# Patient Record
Sex: Male | Born: 1980 | Race: White | Hispanic: No | Marital: Single | Smoking: Current every day smoker
Health system: Southern US, Community
[De-identification: ages and names within clinical notes are randomized; demographics above are authoritative.]

## PROBLEM LIST (undated history)

## (undated) DIAGNOSIS — F39 Unspecified mood [affective] disorder: Secondary | ICD-10-CM

## (undated) DIAGNOSIS — A6920 Lyme disease, unspecified: Secondary | ICD-10-CM

## (undated) DIAGNOSIS — B1920 Unspecified viral hepatitis C without hepatic coma: Secondary | ICD-10-CM

## (undated) DIAGNOSIS — G039 Meningitis, unspecified: Secondary | ICD-10-CM

## (undated) DIAGNOSIS — F1911 Other psychoactive substance abuse, in remission: Secondary | ICD-10-CM

## (undated) DIAGNOSIS — H9190 Unspecified hearing loss, unspecified ear: Secondary | ICD-10-CM

## (undated) DIAGNOSIS — F1410 Cocaine abuse, uncomplicated: Secondary | ICD-10-CM

## (undated) DIAGNOSIS — F4310 Post-traumatic stress disorder, unspecified: Secondary | ICD-10-CM

## (undated) DIAGNOSIS — F639 Impulse disorder, unspecified: Secondary | ICD-10-CM

## (undated) DIAGNOSIS — M542 Cervicalgia: Secondary | ICD-10-CM

## (undated) HISTORY — PX: DG THUMB LEFT HAND: HXRAD1658

## (undated) HISTORY — PX: SKIN GRAFT: SHX250

## (undated) HISTORY — PX: MR HAND RIGHT (ARMC HX): HXRAD1771

---
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
## 2008-07-10 ENCOUNTER — Inpatient Hospital Stay (HOSPITAL_COMMUNITY): Admission: RE | Admit: 2008-07-10 | Discharge: 2008-07-13 | Payer: Self-pay | Admitting: Psychiatry

## 2008-07-10 ENCOUNTER — Ambulatory Visit: Payer: Self-pay | Admitting: Psychiatry

## 2008-07-22 ENCOUNTER — Emergency Department (HOSPITAL_COMMUNITY): Admission: EM | Admit: 2008-07-22 | Discharge: 2008-07-22 | Payer: Self-pay | Admitting: Emergency Medicine

## 2008-07-28 ENCOUNTER — Emergency Department (HOSPITAL_COMMUNITY): Admission: EM | Admit: 2008-07-28 | Discharge: 2008-07-28 | Payer: Self-pay | Admitting: Emergency Medicine

## 2008-08-19 ENCOUNTER — Emergency Department (HOSPITAL_COMMUNITY): Admission: EM | Admit: 2008-08-19 | Discharge: 2008-08-19 | Payer: Self-pay | Admitting: Emergency Medicine

## 2009-04-05 ENCOUNTER — Emergency Department (HOSPITAL_COMMUNITY): Admission: EM | Admit: 2009-04-05 | Discharge: 2009-04-05 | Payer: Self-pay | Admitting: Emergency Medicine
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
## 2009-10-20 ENCOUNTER — Emergency Department (HOSPITAL_COMMUNITY): Admission: EM | Admit: 2009-10-20 | Discharge: 2009-10-20 | Payer: Self-pay | Admitting: Emergency Medicine

## 2009-11-08 ENCOUNTER — Other Ambulatory Visit: Payer: Self-pay | Admitting: Orthopedic Surgery

## 2009-11-08 ENCOUNTER — Ambulatory Visit (HOSPITAL_COMMUNITY): Admission: RE | Admit: 2009-11-08 | Discharge: 2009-11-08 | Payer: Self-pay | Admitting: Psychiatry

## 2009-11-09 ENCOUNTER — Other Ambulatory Visit: Payer: Self-pay | Admitting: Orthopedic Surgery

## 2009-11-09 ENCOUNTER — Inpatient Hospital Stay (HOSPITAL_COMMUNITY): Admission: AD | Admit: 2009-11-09 | Discharge: 2009-11-12 | Payer: Self-pay | Admitting: Psychiatry

## 2009-11-09 ENCOUNTER — Ambulatory Visit: Payer: Self-pay | Admitting: Psychiatry

## 2009-11-12 ENCOUNTER — Emergency Department (HOSPITAL_COMMUNITY): Admission: EM | Admit: 2009-11-12 | Discharge: 2009-11-14 | Payer: Self-pay | Admitting: Emergency Medicine

## 2010-01-06 ENCOUNTER — Emergency Department (HOSPITAL_COMMUNITY): Admission: EM | Admit: 2010-01-06 | Discharge: 2010-01-06 | Payer: Self-pay | Admitting: Emergency Medicine

## 2010-01-07 ENCOUNTER — Emergency Department (HOSPITAL_COMMUNITY): Admission: EM | Admit: 2010-01-07 | Discharge: 2010-01-07 | Payer: Self-pay | Admitting: Family Medicine

## 2010-12-09 LAB — URINALYSIS, ROUTINE W REFLEX MICROSCOPIC
Glucose, UA: NEGATIVE mg/dL
Ketones, ur: NEGATIVE mg/dL
Nitrite: NEGATIVE
Specific Gravity, Urine: 1.023 (ref 1.005–1.030)
pH: 5.5 (ref 5.0–8.0)

## 2010-12-09 LAB — DIFFERENTIAL
Basophils Absolute: 0 10*3/uL (ref 0.0–0.1)
Basophils Absolute: 0 10*3/uL (ref 0.0–0.1)
Basophils Relative: 0 % (ref 0–1)
Eosinophils Absolute: 0 10*3/uL (ref 0.0–0.7)
Eosinophils Absolute: 0.1 10*3/uL (ref 0.0–0.7)
Eosinophils Relative: 0 % (ref 0–5)
Lymphocytes Relative: 15 % (ref 12–46)
Lymphocytes Relative: 31 % (ref 12–46)
Lymphs Abs: 1.7 10*3/uL (ref 0.7–4.0)
Monocytes Absolute: 0.7 10*3/uL (ref 0.1–1.0)
Neutro Abs: 5.7 10*3/uL (ref 1.7–7.7)

## 2010-12-09 LAB — CBC
HCT: 41.9 % (ref 39.0–52.0)
HCT: 43.3 % (ref 39.0–52.0)
Hemoglobin: 13.9 g/dL (ref 13.0–17.0)
Hemoglobin: 14.7 g/dL (ref 13.0–17.0)
MCHC: 33.9 g/dL (ref 30.0–36.0)
MCV: 81.9 fL (ref 78.0–100.0)
MCV: 82.9 fL (ref 78.0–100.0)
Platelets: 240 10*3/uL (ref 150–400)
Platelets: 265 10*3/uL (ref 150–400)
RBC: 5.11 MIL/uL (ref 4.22–5.81)
RBC: 5.22 MIL/uL (ref 4.22–5.81)
RDW: 12.9 % (ref 11.5–15.5)
RDW: 13.2 % (ref 11.5–15.5)
WBC: 9.1 10*3/uL (ref 4.0–10.5)

## 2010-12-09 LAB — BASIC METABOLIC PANEL
Calcium: 9.7 mg/dL (ref 8.4–10.5)
Calcium: 9.7 mg/dL (ref 8.4–10.5)
Chloride: 104 mEq/L (ref 96–112)
Chloride: 107 mEq/L (ref 96–112)
Creatinine, Ser: 1.05 mg/dL (ref 0.4–1.5)
GFR calc Af Amer: >60 mL/min (ref >60)
GFR calc Af Amer: >60 mL/min (ref >60)
GFR calc non Af Amer: >60 mL/min (ref >60)
Glucose, Bld: 98 mg/dL (ref 70–99)
Potassium: 4.2 mEq/L (ref 3.5–5.1)
Potassium: 4.6 mEq/L (ref 3.5–5.1)
Sodium: 139 mEq/L (ref 135–145)

## 2010-12-09 LAB — RAPID URINE DRUG SCREEN, HOSP PERFORMED
Amphetamines: NOT DETECTED
Benzodiazepines: NOT DETECTED

## 2010-12-09 LAB — ETHANOL
Alcohol, Ethyl (B): <5 mg/dL (ref 0–10)
Alcohol, Ethyl (B): <5 mg/dL (ref 0–10)

## 2010-12-09 LAB — TRICYCLICS SCREEN, URINE: TCA Scrn: NOT DETECTED
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
## 2011-06-20 LAB — COMPREHENSIVE METABOLIC PANEL
AST: 20
Albumin: 3.9
Alkaline Phosphatase: 77
Calcium: 9.2
Creatinine, Ser: 1
Sodium: 140
Total Bilirubin: 0.6
Total Protein: 7

## 2011-06-20 LAB — URINALYSIS, ROUTINE W REFLEX MICROSCOPIC
Glucose, UA: NEGATIVE
Hgb urine dipstick: NEGATIVE
Urobilinogen, UA: 0.2
pH: 6

## 2011-06-20 LAB — DRUGS OF ABUSE SCREEN W/O ALC, ROUTINE URINE
Amphetamine Screen, Ur: NEGATIVE
Creatinine,U: 279.2
Marijuana Metabolite: POSITIVE — AB

## 2011-06-20 LAB — CBC
HCT: 44.1
MCHC: 32.9
RDW: 12.6
WBC: 8.2

## 2011-06-20 LAB — OPIATE, QUANTITATIVE, URINE
Morphine, Confirm: 390 ng/mL
Oxycodone, ur: 270 ng/mL

## 2011-08-07 ENCOUNTER — Emergency Department (HOSPITAL_COMMUNITY): Payer: Self-pay

## 2011-08-07 ENCOUNTER — Emergency Department (HOSPITAL_COMMUNITY)
Admission: EM | Admit: 2011-08-07 | Discharge: 2011-08-07 | Disposition: A | Discharge status: Home / Self Care | Payer: Self-pay | Attending: Emergency Medicine | Admitting: Emergency Medicine

## 2011-08-07 ENCOUNTER — Encounter: Payer: Self-pay | Admitting: *Deleted

## 2011-08-07 DIAGNOSIS — S60229A Contusion of unspecified hand, initial encounter: Secondary | ICD-10-CM | POA: Insufficient documentation

## 2011-08-07 DIAGNOSIS — S0990XA Unspecified injury of head, initial encounter: Secondary | ICD-10-CM | POA: Insufficient documentation

## 2011-08-07 DIAGNOSIS — R51 Headache: Secondary | ICD-10-CM | POA: Insufficient documentation

## 2011-08-07 DIAGNOSIS — S1093XA Contusion of unspecified part of neck, initial encounter: Secondary | ICD-10-CM | POA: Insufficient documentation

## 2011-08-07 DIAGNOSIS — S0083XA Contusion of other part of head, initial encounter: Secondary | ICD-10-CM

## 2011-08-07 DIAGNOSIS — M79609 Pain in unspecified limb: Secondary | ICD-10-CM | POA: Insufficient documentation

## 2011-08-07 DIAGNOSIS — M542 Cervicalgia: Secondary | ICD-10-CM | POA: Insufficient documentation

## 2011-08-07 DIAGNOSIS — M25519 Pain in unspecified shoulder: Secondary | ICD-10-CM | POA: Insufficient documentation

## 2011-08-07 DIAGNOSIS — S0003XA Contusion of scalp, initial encounter: Secondary | ICD-10-CM | POA: Insufficient documentation

## 2011-08-07 DIAGNOSIS — M549 Dorsalgia, unspecified: Secondary | ICD-10-CM | POA: Insufficient documentation

## 2011-08-07 DIAGNOSIS — H5789 Other specified disorders of eye and adnexa: Secondary | ICD-10-CM | POA: Insufficient documentation

## 2011-08-07 DIAGNOSIS — S60222A Contusion of left hand, initial encounter: Secondary | ICD-10-CM

## 2011-08-07 HISTORY — DX: Unspecified hearing loss, unspecified ear: H91.90

## 2011-08-07 LAB — POCT I-STAT, CHEM 8
BUN: 15 mg/dL (ref 6–23)
Calcium, Ion: 1.12 mmol/L (ref 1.12–1.32)
Chloride: 108 mEq/L (ref 96–112)
Creatinine, Ser: 1.1 mg/dL (ref 0.50–1.35)
Glucose, Bld: 106 mg/dL — ABNORMAL HIGH (ref 70–99)
HCT: 46 % (ref 39.0–52.0)
Hemoglobin: 15.6 g/dL (ref 13.0–17.0)
Potassium: 4.2 mEq/L (ref 3.5–5.1)
Sodium: 140 mEq/L (ref 135–145)
TCO2: 22 mmol/L (ref 0–100)

## 2011-08-07 MED ORDER — HYDROMORPHONE HCL PF 1 MG/ML IJ SOLN
1.0000 mg | Freq: Once | INTRAMUSCULAR | Status: AC
  Administered 2011-08-07: 1 mg via INTRAVENOUS
  Filled 2011-08-07: qty 1

## 2011-08-07 MED ORDER — IBUPROFEN 200 MG PO TABS
400.0000 mg | ORAL_TABLET | Freq: Once | ORAL | Status: AC
  Administered 2011-08-07: 400 mg via ORAL
  Filled 2011-08-07: qty 2

## 2011-08-07 MED ORDER — SODIUM CHLORIDE 0.9 % IV BOLUS (SEPSIS)
1000.0000 mL | Freq: Once | INTRAVENOUS | Status: AC
  Administered 2011-08-07: 1000 mL via INTRAVENOUS

## 2011-08-07 NOTE — ED Provider Notes (Signed)
Medical screening examination/treatment/procedure(s) were performed by non-physician practitioner and as supervising physician I was immediately available for consultation/collaboration.  Joanathan Affeldt, MD 08/07/11 2212 

## 2011-08-07 NOTE — ED Notes (Signed)
Pt to CT at this time.  C-collar remains in place.  Unable to move pt to another room at this time due to high aquity

## 2011-08-07 NOTE — ED Notes (Signed)
[employer] is at the bedside at this time.  Pt being checked in and has VS checked at this time.

## 2011-08-07 NOTE — ED Notes (Signed)
Pt undressed, clothes with [employer].  Placed in hospital gown.  Pt is alert and oriented.  Sever left shoulder pain, ? Deformity, pt can not move the arm.  GCS 15, PERRLA
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
## 2012-05-09 ENCOUNTER — Emergency Department (HOSPITAL_COMMUNITY)
Admission: EM | Admit: 2012-05-09 | Discharge: 2012-05-09 | Disposition: A | Discharge status: Home / Self Care | Payer: Self-pay | Attending: Emergency Medicine | Admitting: Emergency Medicine

## 2012-05-09 DIAGNOSIS — F17200 Nicotine dependence, unspecified, uncomplicated: Secondary | ICD-10-CM | POA: Insufficient documentation

## 2012-05-09 DIAGNOSIS — M542 Cervicalgia: Secondary | ICD-10-CM | POA: Insufficient documentation

## 2012-05-09 MED ORDER — OXYCODONE HCL 5 MG PO TABS: 5.0000 mg | ORAL_TABLET | ORAL | Status: AC | PRN

## 2012-05-09 NOTE — ED Notes (Signed)
Pt given on prescrip and told not to drive while taking pain meds.
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
## 2012-05-09 NOTE — ED Provider Notes (Signed)
Medical screening examination/treatment/procedure(s) were performed by non-physician practitioner and as supervising physician I was immediately available for consultation/collaboration.    Calistro Rauf L Taryn Shellhammer, MD 05/09/12 2331 

## 2012-05-09 NOTE — ED Provider Notes (Signed)
History     CSN: [id number]  Arrival date & time 05/09/12  1612   None     Chief Complaint  Patient presents with  . Neck Pain    (Consider location/radiation/quality/duration/timing/severity/associated sxs/prior treatment) HPI  31 y.o. male in no acute distress complaining of exacerbation to chronic neck pain. Patient denies any numbness, paresthesias or weakness. He also denies any recent trauma. Used to be taking Suboxone for pain but he says he cannot afford it because his Medicaid was cut off. Patient says he has an appointment with his primary care doctor for pain management next week.   Past Medical History  Diagnosis Date  . HOH (hard of hearing)     No past surgical history on file.  No family history on file.  History  Substance Use Topics  . Smoking status: Current Everyday Smoker -- 0.5 packs/day    Types: Cigarettes  . Smokeless tobacco: Not on file  . Alcohol Use: Yes      Review of Systems  Musculoskeletal: Positive for back pain.  All other systems reviewed and are negative.    Allergies  Tylenol  Home Medications   Current Outpatient Rx  Name Route Sig Dispense Refill  . BUPRENORPHINE HCL-NALOXONE HCL 8-2 MG SL SUBL Sublingual Place 1 tablet under the tongue 2 (two) times daily.      BP 137/78  Pulse 66  Temp 98.3 F (36.8 C) (Oral)  Resp 15  SpO2 100%  Physical Exam  Nursing note and vitals reviewed. Constitutional: He is oriented to person, place, and time. He appears well-developed and well-nourished. No distress.  HENT:  Head: Normocephalic.  Eyes: Conjunctivae and EOM are normal.  Neck: Normal range of motion. Neck supple.       Surgical scar  Cardiovascular: Normal rate.   Pulmonary/Chest: Effort normal.  Musculoskeletal: Normal range of motion.  Neurological: He is alert and oriented to person, place, and time.       Strength 5 out of 5x4 extremities.  Skin: Skin is warm and dry.  Psychiatric: He has a normal mood and  affect.    ED Course  Procedures (including critical care time)  Labs Reviewed - No data to display No results found.   1. Cervicalgia       MDM  Patient presenting with exacerbation 2 chronic neck pain he was previously taking Suboxone and patient states he cannot afford it anymore I will bring him for 10 5 mg oxycodone and I have instructed him that this pain must be managed at a primary care. Patient states he has a primary care appointment next week. Pt verbalized understanding and agrees with care plan. Outpatient follow-up and return precautions given.           Wynetta Emery, PA-C 05/09/12 1850

## 2012-12-05 ENCOUNTER — Encounter (HOSPITAL_COMMUNITY): Payer: Self-pay

## 2012-12-05 ENCOUNTER — Emergency Department (HOSPITAL_COMMUNITY)
Admission: EM | Admit: 2012-12-05 | Discharge: 2012-12-05 | Disposition: A | Discharge status: Other Acute Inpatient Hospital | Payer: Self-pay | Attending: Emergency Medicine | Admitting: Emergency Medicine

## 2012-12-05 ENCOUNTER — Encounter (HOSPITAL_COMMUNITY): Payer: Self-pay | Admitting: Emergency Medicine

## 2012-12-05 ENCOUNTER — Inpatient Hospital Stay (HOSPITAL_COMMUNITY)
Admission: AD | Admit: 2012-12-05 | Discharge: 2012-12-09 | DRG: 897 | Disposition: A | Discharge status: Home / Self Care | Payer: Federal, State, Local not specified - Other | Source: Intra-hospital | Attending: Psychiatry | Admitting: Psychiatry

## 2012-12-05 DIAGNOSIS — F1410 Cocaine abuse, uncomplicated: Secondary | ICD-10-CM | POA: Insufficient documentation

## 2012-12-05 DIAGNOSIS — F39 Unspecified mood [affective] disorder: Secondary | ICD-10-CM | POA: Diagnosis present

## 2012-12-05 DIAGNOSIS — R45851 Suicidal ideations: Secondary | ICD-10-CM

## 2012-12-05 DIAGNOSIS — Z8669 Personal history of other diseases of the nervous system and sense organs: Secondary | ICD-10-CM | POA: Insufficient documentation

## 2012-12-05 DIAGNOSIS — Z8661 Personal history of infections of the central nervous system: Secondary | ICD-10-CM | POA: Insufficient documentation

## 2012-12-05 DIAGNOSIS — F1120 Opioid dependence, uncomplicated: Principal | ICD-10-CM | POA: Diagnosis present

## 2012-12-05 DIAGNOSIS — F1110 Opioid abuse, uncomplicated: Secondary | ICD-10-CM

## 2012-12-05 DIAGNOSIS — Z79899 Other long term (current) drug therapy: Secondary | ICD-10-CM | POA: Insufficient documentation

## 2012-12-05 DIAGNOSIS — F1910 Other psychoactive substance abuse, uncomplicated: Secondary | ICD-10-CM

## 2012-12-05 DIAGNOSIS — Z8739 Personal history of other diseases of the musculoskeletal system and connective tissue: Secondary | ICD-10-CM | POA: Insufficient documentation

## 2012-12-05 DIAGNOSIS — F17200 Nicotine dependence, unspecified, uncomplicated: Secondary | ICD-10-CM | POA: Insufficient documentation

## 2012-12-05 DIAGNOSIS — F639 Impulse disorder, unspecified: Secondary | ICD-10-CM | POA: Diagnosis present

## 2012-12-05 DIAGNOSIS — F4310 Post-traumatic stress disorder, unspecified: Secondary | ICD-10-CM | POA: Diagnosis present

## 2012-12-05 HISTORY — DX: Cervicalgia: M54.2

## 2012-12-05 HISTORY — DX: Meningitis, unspecified: G03.9

## 2012-12-05 LAB — COMPREHENSIVE METABOLIC PANEL
Albumin: 4.2 g/dL (ref 3.5–5.2)
BUN: 11 mg/dL (ref 6–23)
Chloride: 99 mEq/L (ref 96–112)
Creatinine, Ser: 1.02 mg/dL (ref 0.50–1.35)
GFR calc Af Amer: >90 mL/min (ref >90)
GFR calc non Af Amer: >90 mL/min (ref >90)
Total Bilirubin: 0.3 mg/dL (ref 0.3–1.2)

## 2012-12-05 LAB — CBC
HCT: 42.5 % (ref 39.0–52.0)
MCV: 81.6 fL (ref 78.0–100.0)
RDW: 13.3 % (ref 11.5–15.5)
WBC: 11.1 10*3/uL — ABNORMAL HIGH (ref 4.0–10.5)

## 2012-12-05 LAB — SALICYLATE LEVEL: Salicylate Lvl: <2 mg/dL — ABNORMAL LOW (ref 2.8–20.0)

## 2012-12-05 LAB — ETHANOL: Alcohol, Ethyl (B): <11 mg/dL (ref 0–11)

## 2012-12-05 MED ORDER — LORAZEPAM 1 MG PO TABS
1.0000 mg | ORAL_TABLET | Freq: Three times a day (TID) | ORAL | Status: DC | PRN
  Administered 2012-12-05: 1 mg via ORAL
  Filled 2012-12-05: qty 1

## 2012-12-05 MED ORDER — ONDANSETRON HCL 4 MG PO TABS: 4.0000 mg | ORAL_TABLET | Freq: Three times a day (TID) | ORAL | Status: DC | PRN

## 2012-12-05 MED ORDER — NICOTINE 21 MG/24HR TD PT24: 21.0000 mg | MEDICATED_PATCH | Freq: Every day | TRANSDERMAL | Status: DC

## 2012-12-05 MED ORDER — ALUM & MAG HYDROXIDE-SIMETH 200-200-20 MG/5ML PO SUSP: 30.0000 mL | ORAL | Status: DC | PRN

## 2012-12-05 MED ORDER — IBUPROFEN 600 MG PO TABS: 600.0000 mg | ORAL_TABLET | Freq: Three times a day (TID) | ORAL | Status: DC | PRN

## 2012-12-05 MED ORDER — ZIPRASIDONE MESYLATE 20 MG IM SOLR: 20.0000 mg | INTRAMUSCULAR | Status: DC | PRN

## 2012-12-05 MED ORDER — ZOLPIDEM TARTRATE 5 MG PO TABS: 5.0000 mg | ORAL_TABLET | Freq: Every evening | ORAL | Status: DC | PRN
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
## 2012-12-05 NOTE — Progress Notes (Signed)
  CM spoke with pt who confirms self pay [facility] resident with no pcp. CM discussed written information for self pay pcps, importance of pcp for f/u care, www.needymeds.org, discounted pharmacies, MATCH program and other [place] resources such as financial assistance, DSS and  health department Reviewed Health connect number to assist with finding self pay provider close to pt's residence. Reviewed resources for [place] self pay pcps like [facility], family medicine at [address], [facility] family practice, general medical clinics, [facility] urgent care plus others, [facility] out patient [facility]ies, housing, affordable care act/health reform (deadline 12/16/12) and other resources in [employer]. Pt voiced understanding and appreciation of resources provided   Pt agreed to receive these written resources upon discharge along with her d/c instructions. BH RN informed and CM requested pt be given written information at d/c

## 2012-12-05 NOTE — ED Notes (Signed)
Per patient, relapse on cocaine last night, was clean for 6 months, sat in floor with knives,started having suicidal thoughts

## 2012-12-05 NOTE — ED Provider Notes (Signed)
History     CSN: [id number]  Arrival date & time 12/05/12  4098   First MD Initiated Contact with Patient 12/05/12 [phone]      Chief Complaint  Patient presents with  . Medical Clearance    (Consider location/radiation/quality/duration/timing/severity/associated sxs/prior treatment) HPI Pt presenting with c/o substance abuse and suicidal thoughts/ideations. Pt states he had not been using for 6 months, then last night used cocaine "all night"  He states he was trying to kill himself with cocaine.  Also sat with a knife and tried to use the knife to kill himself.  He states over the past month symptoms have been worsening.  He had outpatient appiontment tomorrow to try to get into [facility], but states that he feels he cannot wait this long.  No recent illness.  No fever, no vomiting.  There are no other associated systemic symptoms, there are no other alleviating or modifying factors.   Past Medical History  Diagnosis Date  . HOH (hard of hearing)   . Neck pain   . Meningitis     Past Surgical History  Procedure Laterality Date  . Skin graft      No family history on file.  History  Substance Use Topics  . Smoking status: Current Every Day Smoker -- 0.50 packs/day    Types: Cigarettes  . Smokeless tobacco: Not on file  . Alcohol Use: No      Review of Systems ROS reviewed and all otherwise negative except for mentioned in HPI  Allergies  Tylenol  Home Medications   Current Outpatient Rx  Name  Route  Sig  Dispense  Refill  . buprenorphine-naloxone (SUBOXONE) 8-2 MG SUBL   Sublingual   Place 1 tablet under the tongue 2 (two) times daily.         . diazepam (VALIUM) 10 MG tablet   Oral   Take 10 mg by mouth every 6 (six) hours as needed for anxiety.         Marland Kitchen zolpidem (AMBIEN) 10 MG tablet   Oral   Take 10 mg by mouth at bedtime as needed for sleep.           BP 126/74  Pulse 84  Temp(Src) 98.5 F (36.9 C) (Oral)  Resp 18  SpO2 97% Vitals  reviewed Physical Exam Physical Examination: General appearance - alert, well appearing, and in no distress Mental status - alert, oriented to person, place, and time Eyes - no scleral icterus, no conjunctival injection Mouth - mucous membranes moist, pharynx normal without lesions Chest - clear to auscultation, no wheezes, rales or rhonchi, symmetric air entry Heart - normal rate, regular rhythm, normal S1, S2, no murmurs, rubs, clicks or gallops Musculoskeletal - no joint tenderness, deformity or swelling Extremities - peripheral pulses normal, no pedal edema, no clubbing or cyanosis Skin - normal coloration and turgor, no rashes Psych- flat affect, but cooperative  ED Course  Procedures (including critical care time)  11:09 AM  i have initiated IVC papers on this patient. He told me that he felt suicidal and homicidal, then when his mother left, was trying to leave the ED and stated he wanted to go to another hospital.  As he is suicidal I do not believe I can safely allow him to leave.  Will need ACT consult, telepsych, psych holding orders written.    Labs Reviewed  CBC - Abnormal; Notable for the following:    WBC 11.1 (*)    All other components within  normal limits  COMPREHENSIVE METABOLIC PANEL - Abnormal; Notable for the following:    Glucose, Bld 103 (*)    All other components within normal limits  SALICYLATE LEVEL - Abnormal; Notable for the following:    Salicylate Lvl <2.0 (*)    All other components within normal limits  URINE RAPID DRUG SCREEN (HOSP PERFORMED) - Abnormal; Notable for the following:    Opiates POSITIVE (*)    Cocaine POSITIVE (*)    All other components within normal limits  ACETAMINOPHEN LEVEL  ETHANOL   No results found.   1. Suicidal ideation   2. Substance abuse       MDM  Pt presenting with c/o feeling suicidal and cocaine abuse.  Psych holding orders written, d/w ACT team, Dr. Martin Majestic will see patient this afternoon.  Pt is medically  clear at this time.          Ethelda Chick, MD 12/05/12 [phone]

## 2012-12-05 NOTE — ED Notes (Signed)
Patient restless, requesting anxiety medication. Medication given as ordered by MD.
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
## 2012-12-05 NOTE — ED Notes (Signed)
Patient states "he doesn't know what he will do"-states he recently started using cocaine b/c he was placed on ceboxone and they "go together"
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
## 2012-12-05 NOTE — ED Notes (Signed)
Pt. Mother took his book bag, note book and clothing home.

## 2012-12-06 ENCOUNTER — Encounter (HOSPITAL_COMMUNITY): Payer: Self-pay | Admitting: Psychiatry

## 2012-12-06 DIAGNOSIS — F4310 Post-traumatic stress disorder, unspecified: Secondary | ICD-10-CM | POA: Diagnosis present

## 2012-12-06 DIAGNOSIS — F1410 Cocaine abuse, uncomplicated: Secondary | ICD-10-CM | POA: Diagnosis present

## 2012-12-06 DIAGNOSIS — F1120 Opioid dependence, uncomplicated: Secondary | ICD-10-CM | POA: Diagnosis present

## 2012-12-06 DIAGNOSIS — F39 Unspecified mood [affective] disorder: Secondary | ICD-10-CM | POA: Diagnosis present

## 2012-12-06 DIAGNOSIS — F639 Impulse disorder, unspecified: Secondary | ICD-10-CM | POA: Diagnosis present

## 2012-12-06 MED ORDER — ALUM & MAG HYDROXIDE-SIMETH 200-200-20 MG/5ML PO SUSP
30.0000 mL | ORAL | Status: DC | PRN
  Administered 2012-12-08: 30 mL via ORAL

## 2012-12-06 MED ORDER — LOPERAMIDE HCL 2 MG PO CAPS: 2.0000 mg | ORAL_CAPSULE | ORAL | Status: DC | PRN

## 2012-12-06 MED ORDER — CLONIDINE HCL 0.1 MG PO TABS: 0.1000 mg | ORAL_TABLET | ORAL | Status: DC

## 2012-12-06 MED ORDER — MAGNESIUM HYDROXIDE 400 MG/5ML PO SUSP
30.0000 mL | Freq: Every day | ORAL | Status: DC | PRN
  Administered 2012-12-08: 30 mL via ORAL

## 2012-12-06 MED ORDER — BUPRENORPHINE HCL 8 MG SL SUBL
8.0000 mg | SUBLINGUAL_TABLET | Freq: Once | SUBLINGUAL | Status: AC
  Administered 2012-12-06: 8 mg via SUBLINGUAL
  Filled 2012-12-06: qty 1

## 2012-12-06 MED ORDER — BUPRENORPHINE HCL 2 MG SL SUBL
8.0000 mg | SUBLINGUAL_TABLET | Freq: Two times a day (BID) | SUBLINGUAL | Status: DC
  Administered 2012-12-07 – 2012-12-09 (×5): 8 mg via SUBLINGUAL
  Filled 2012-12-06: qty 1
  Filled 2012-12-06 (×2): qty 4
  Filled 2012-12-06: qty 1
  Filled 2012-12-06: qty 4

## 2012-12-06 MED ORDER — DICYCLOMINE HCL 20 MG PO TABS: 20.0000 mg | ORAL_TABLET | Freq: Four times a day (QID) | ORAL | Status: DC | PRN

## 2012-12-06 MED ORDER — NICOTINE 21 MG/24HR TD PT24
21.0000 mg | MEDICATED_PATCH | Freq: Every day | TRANSDERMAL | Status: DC
  Administered 2012-12-06 – 2012-12-09 (×4): 21 mg via TRANSDERMAL
  Filled 2012-12-06 (×6): qty 1

## 2012-12-06 MED ORDER — ZOLPIDEM TARTRATE 10 MG PO TABS
ORAL_TABLET | ORAL | Status: AC
  Administered 2012-12-06: 10 mg via ORAL
  Filled 2012-12-06: qty 1

## 2012-12-06 MED ORDER — NAPROXEN 500 MG PO TABS
500.0000 mg | ORAL_TABLET | Freq: Two times a day (BID) | ORAL | Status: DC | PRN
  Administered 2012-12-07: 500 mg via ORAL
  Filled 2012-12-06: qty 1

## 2012-12-06 MED ORDER — ONDANSETRON 4 MG PO TBDP: 4.0000 mg | ORAL_TABLET | Freq: Four times a day (QID) | ORAL | Status: DC | PRN

## 2012-12-06 MED ORDER — HYDROXYZINE HCL 25 MG PO TABS
25.0000 mg | ORAL_TABLET | Freq: Four times a day (QID) | ORAL | Status: DC | PRN
  Administered 2012-12-06 – 2012-12-08 (×5): 25 mg via ORAL

## 2012-12-06 MED ORDER — ZOLPIDEM TARTRATE 10 MG PO TABS
10.0000 mg | ORAL_TABLET | Freq: Every evening | ORAL | Status: DC | PRN
  Administered 2012-12-06 – 2012-12-07 (×2): 10 mg via ORAL
  Filled 2012-12-06 (×2): qty 1

## 2012-12-06 MED ORDER — METHOCARBAMOL 500 MG PO TABS
500.0000 mg | ORAL_TABLET | Freq: Three times a day (TID) | ORAL | Status: DC | PRN
  Administered 2012-12-06 – 2012-12-08 (×5): 500 mg via ORAL
  Filled 2012-12-06 (×6): qty 1

## 2012-12-06 MED ORDER — CLONIDINE HCL 0.1 MG PO TABS
0.1000 mg | ORAL_TABLET | Freq: Four times a day (QID) | ORAL | Status: DC
  Administered 2012-12-06 (×2): 0.1 mg via ORAL
  Filled 2012-12-06 (×9): qty 1

## 2012-12-06 MED ORDER — CLONIDINE HCL 0.1 MG PO TABS: 0.1000 mg | ORAL_TABLET | Freq: Every day | ORAL | Status: DC
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
## 2012-12-06 NOTE — [facility] Suicide Risk Assessment (Signed)
Suicide Risk Assessment  Admission Assessment     Nursing information obtained from:  Patient Demographic factors:  Male;Adolescent or young adult;Caucasian;Living alone Current Mental Status:  NA Loss Factors:  NA Historical Factors:  Family history of mental illness or substance abuse;Impulsivity Risk Reduction Factors:  Sense of responsibility to family;Positive social support  CLINICAL FACTORS:   Depression:   Comorbid alcohol abuse/dependence Alcohol/Substance Abuse/Dependencies  COGNITIVE FEATURES THAT CONTRIBUTE TO RISK:  Closed-mindedness Polarized thinking Thought constriction (tunnel vision)    SUICIDE RISK:   Moderate:  Frequent suicidal ideation with limited intensity, and duration, some specificity in terms of plans, no associated intent, good self-control, limited dysphoria/symptomatology, some risk factors present, and identifiable protective factors, including available and accessible social support.  PLAN OF CARE: Supportive approach/coping skills/relapse prevention                               Resume Suboxone  I certify that inpatient services furnished can reasonably be expected to improve the patient's condition.  Jarquis Walker A 12/06/2012, 4:39 PM
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
## 2012-12-06 NOTE — Progress Notes (Signed)
[facility] LCSW Aftercare Discharge Planning Group Note  12/06/2012 8:45 AM  Participation Quality:  Did Not Attend  Blake Green 12/06/2012, 12:53 PM

## 2012-12-06 NOTE — Progress Notes (Signed)
[facility] LCSW Group Therapy  12/06/2012 1:15 PM  Type of Therapy:  Group Therapy 1:15 to 2:30 PM   Participation Level:  Did Not Attend  Clide Dales

## 2012-12-06 NOTE — Tx Team (Signed)
Interdisciplinary Treatment Plan Update (Adult)  Date: 12/06/2012  Time Reviewed: 9:47 AM   Progress in Treatment: Attending groups: No Participating in groups: No Taking medication as prescribed:  Yes Tolerating medication:  Yes Family/Significant othe contact made: No Patient understands diagnosis: Yes Discussing patient identified problems/goals with staff: Yes Medical problems stabilized or resolved:  Chronic Pain and HTN Denies suicidal/homicidal ideation: Yes Patient has not harmed self or Others: Yes  New problem(s) identified: None Identified  Discharge Plan or Barriers:  CSW to assess for appropriate referrals; patient just came in overnight  Additional comments: N/A  Reason for Continuation of Hospitalization: Withdrawal symptoms   Estimated length of stay: 3-5 days  For review of initial/current patient goals, please see plan of care.  Attendees: Patient:     Family:     Physician:  Geoffery Lyons 12/06/2012 9:47AM   Nursing:   Leighton Parody, RN 12/06/2012 9:47 AM   Clinical Social Worker Ronda Fairly 12/06/2012 9:47 AM   Other:  Serena Colonel, NP 12/06/2012 9:47 AM   Other: Margret Chance, RN 12/06/2012 9:47 AM   Other:  Sharmon Revere, Haroldine Laws Nursing Student 12/06/2012 9:47 AM   Other:   12/06/2012 9:47 AM    Scribe for Treatment Team:   Carney Bern, LCSWA  12/06/2012 9:47 AM
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
## 2012-12-06 NOTE — Progress Notes (Signed)
[name] is out in milieu but did not attend am group.  He rates depression at 4 and hopelessness at 3.  States "I need to back on Suboxone because of chronic pain."  Denies SI.  Reports fair sleep and good appetite. Also reports multiple s/s of withdrawal, but no prn medications requested.  A- Explained disease concept of addiction.  Blake Green has minimal insight and is not vested in long term recovery suggestions. Continue current POC and evaluation of treatment goals.  Continue 15' checks for safety. R- Remains safe on the unit.

## 2012-12-06 NOTE — Progress Notes (Signed)
[facility] Group Notes:  (Nursing/MHT/Case Management/Adjunct)  Date:  12/06/2012  Time:  11:30 AM  Type of Therapy:  Therapeutic Activity  Participation Level:  Did Not Attend  Dalia Heading 12/06/2012, 11:30 AM

## 2012-12-07 DIAGNOSIS — F1410 Cocaine abuse, uncomplicated: Secondary | ICD-10-CM

## 2012-12-07 DIAGNOSIS — F4310 Post-traumatic stress disorder, unspecified: Secondary | ICD-10-CM

## 2012-12-07 DIAGNOSIS — F639 Impulse disorder, unspecified: Secondary | ICD-10-CM

## 2012-12-07 DIAGNOSIS — F1120 Opioid dependence, uncomplicated: Principal | ICD-10-CM

## 2012-12-07 NOTE — Progress Notes (Signed)
Patient did attend the evening speaker AA meeting.  

## 2012-12-07 NOTE — Progress Notes (Signed)
Pt attended AA group; pt was inattentive, working on [employer] throughout its entirety.

## 2012-12-07 NOTE — Progress Notes (Addendum)
D: Appears bright and animated on approach and in conversation. Calm and cooperative with assessment. No acute distress noted. States he is not having any withdrawal symptoms. Feels like he is ready for D/C. Rated his depression and hopelessness a "1". Hopeful for D/C tomorrow r/t needing to get back to work on Monday. Did endorse neck pain. Has no questions or concerns otherwise. Denies SI/HI/AVH and contracts for safety.   A: Safety maintained with q15 minute observation. Support and encouragement provided. Meds given as ordered by MD. PRN provided for pain. Encouraged group participation.   R: Mood remains neutral. Visible on milieu and interacting well with peers. He is compliant with unit programming and POC. Remains safe on q15 min observation. Will f/u response to prn, continue current POC and q15 minute observation.
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
## 2012-12-07 NOTE — Clinical Social Work Note (Signed)
[facility] Group Notes:  (Clinical Social Work)  12/07/2012     10-11AM  Summary of Progress/Problems:   The main focus of today's process group was for the patient to identify ways in which they have in the past sabotaged their own recovery. Motivational Interviewing was utilized to ask the group members what they get out of their substance use, and what they want to change.  The Stages of Change were explained, and members identified where they currently are with regard to stages of change.  The patient expressed that he is in the preparation stage of change, and his motivation is 10 out of 10.  He engaged in a couple of side conversations, so it is not clear how much he got out of the discussion.  He did copy down the stages of change.  Type of Therapy:  Group Therapy - Process   Participation Level:  Active  Participation Quality:  Appropriate, Attentive, Inattentive and Sharing  Affect:  Appropriate  Cognitive:  Oriented  Insight:  Engaged  Engagement in Therapy:  Engaged  Modes of Intervention:  Education, Teacher, English as a foreign language, Exploration, Discussion, Motivational Interviewing   Ambrose Mantle, LCSW 12/07/2012, 11:20 AM
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
## 2012-12-07 NOTE — Progress Notes (Signed)
Adult Psychoeducational Group Note  Date:  12/07/2012 Time:  0900  Group Topic/Focus:  Patient Self Inventory Review Group  Participation Level:  Active  Participation Quality:  Appropriate, Attentive, Sharing and Supportive  Affect:  Appropriate and Depressed  Cognitive:  Alert and Appropriate  Insight: Appropriate, Good and Improving  Engagement in Group:  Developing/Improving, Engaged, Improving and Supportive  Modes of Intervention:  Discussion and Support  Additional Comments:  Patient discussed his support system and encouraged other patients to follow through with their treatment and to stay sober after discharge.  "Believe in yourself and don't use after your discharge."  Earline Mayotte 12/07/2012, 11:03 AM

## 2012-12-07 NOTE — Progress Notes (Signed)
[facility] MD Progress Note  12/07/2012 2:20 PM Blake Green Xxx-[place]  MRN:  [id number]  Subjective: "I still have the suicidal thoughts off and on. I'm not sleeping well. I don't think that I feel ready to go home yet. I got this ringing in my ear. It has been going on for a while. I was told that it is not coming from my medication.  Diagnosis:   Axis I: Post Traumatic Stress Disorder and Opioid dependence,  Cocaine abuse, Impulse control disorder Axis II: Deferred Axis III:  Past Medical History  Diagnosis Date  . HOH (hard of hearing)   . Neck pain   . Meningitis    Axis IV: other psychosocial or environmental problems Axis V: 41-50 serious symptoms  ADL's:  Intact  Sleep: Good  Appetite:  Good  Suicidal Ideation:  Plan:  Denies Intent:  Denies Means:  denies Homicidal Ideation:  Plan:  Denies Intent:  denies Means:  denies AEB (as evidenced by):  Psychiatric Specialty Exam: Review of Systems  Constitutional: Negative.   HENT: Negative.   Eyes: Negative.   Respiratory: Negative.   Cardiovascular: Negative.   Gastrointestinal: Negative.   Genitourinary: Negative.   Musculoskeletal: Negative.   Skin: Negative.   Neurological: Negative.   Endo/Heme/Allergies: Negative.   Psychiatric/Behavioral: Positive for suicidal ideas (Off and on) and substance abuse. Negative for depression, hallucinations and memory loss. The patient is not nervous/anxious and does not have insomnia.     Blood pressure 120/70, pulse 94, temperature 98.3 F (36.8 C), temperature source Oral, resp. rate 20, height 7' 2.5" (2.197 m).There is no weight on file to calculate BMI.  General Appearance: Casual  Eye Contact::  Good  Speech:  Clear and Coherent  Volume:  Normal  Mood:  Depressed, rated #3  Affect:  Non-Congruent with reported mood.  Thought Process:  Coherent and Intact  Orientation:  Full (Time, Place, and Person)  Thought Content:  Rumination  Suicidal Thoughts:  Yes.  without  intent/plan  Homicidal Thoughts:  No  Memory:  Immediate;   Good Recent;   Good Remote;   Good  Judgement:  Fair  Insight:  Fair  Psychomotor Activity:  Increased  Concentration:  Fair  Recall:  Fair  Akathisia:  No  Handed:  Right  AIMS (if indicated):     Assets:  Desire for Improvement  Sleep:  Number of Hours: 2.75   Current Medications: Current Facility-Administered Medications  Medication Dose Route Frequency Provider Last Rate Last Dose  . alum & mag hydroxide-simeth (MAALOX/MYLANTA) 200-200-20 MG/5ML suspension 30 mL  30 mL Oral Q4H PRN Nehemiah Settle, MD      . buprenorphine (SUBUTEX) sublingual tablet 8 mg  8 mg Sublingual BID Rachael Fee, MD   8 mg at 12/07/12 [phone]  . dicyclomine (BENTYL) tablet 20 mg  20 mg Oral Q6H PRN Nehemiah Settle, MD      . hydrOXYzine (ATARAX/VISTARIL) tablet 25 mg  25 mg Oral Q6H PRN Nehemiah Settle, MD   25 mg at 12/06/12 2205  . loperamide (IMODIUM) capsule 2-4 mg  2-4 mg Oral PRN Nehemiah Settle, MD      . magnesium hydroxide (MILK OF MAGNESIA) suspension 30 mL  30 mL Oral Daily PRN Nehemiah Settle, MD      . methocarbamol (ROBAXIN) tablet 500 mg  500 mg Oral Q8H PRN Nehemiah Settle, MD   500 mg at 12/06/12 2311  . naproxen (NAPROSYN) tablet 500 mg  500  mg Oral BID PRN Nehemiah Settle, MD   500 mg at 12/07/12 0816  . nicotine (NICODERM CQ - dosed in mg/24 hours) patch 21 mg  21 mg Transdermal Daily Rachael Fee, MD   21 mg at 12/07/12 [phone]  . ondansetron (ZOFRAN-ODT) disintegrating tablet 4 mg  4 mg Oral Q6H PRN Nehemiah Settle, MD      . zolpidem (AMBIEN) tablet 10 mg  10 mg Oral QHS PRN Rachael Fee, MD   10 mg at 12/06/12 2311    Lab Results: No results found for this or any previous visit (from the past 48 hour(s)).  Physical Findings: AIMS: Facial and Oral Movements Muscles of Facial Expression: None, normal Lips and Perioral Area: None, normal Jaw:  None, normal Tongue: None, normal,Extremity Movements Upper (arms, wrists, hands, fingers): None, normal Lower (legs, knees, ankles, toes): None, normal, Trunk Movements Neck, shoulders, hips: None, normal, Overall Severity Severity of abnormal movements (highest score from questions above): None, normal Incapacitation due to abnormal movements: None, normal Patient's awareness of abnormal movements (rate only patient's report): No Awareness, Dental Status Current problems with teeth and/or dentures?: No Does patient usually wear dentures?: No  CIWA:  CIWA-Ar Total: 0 COWS:  COWS Total Score: 6  Treatment Plan Summary: Daily contact with patient to assess and evaluate symptoms and progress in treatment Medication management  Plan:  Supportive approach/coping skills/relapse prevention. No changes made on the current treatment plan. Continue plan of care.  Medical Decision Making Problem Points:  Established problem, stable/improving (1), Review of last therapy session (1) and Review of psycho-social stressors (1) Data Points:  Review of medication regiment & side effects (2) Review of new medications or change in dosage (2)  I certify that inpatient services furnished can reasonably be expected to improve the patient's condition.   Armandina Stammer I 12/07/2012, 2:20 PM

## 2012-12-08 DIAGNOSIS — F1994 Other psychoactive substance use, unspecified with psychoactive substance-induced mood disorder: Secondary | ICD-10-CM

## 2012-12-08 DIAGNOSIS — F329 Major depressive disorder, single episode, unspecified: Secondary | ICD-10-CM

## 2012-12-08 DIAGNOSIS — F1910 Other psychoactive substance abuse, uncomplicated: Secondary | ICD-10-CM

## 2012-12-08 DIAGNOSIS — F411 Generalized anxiety disorder: Secondary | ICD-10-CM

## 2012-12-08 MED ORDER — ZOLPIDEM TARTRATE 10 MG PO TABS
10.0000 mg | ORAL_TABLET | Freq: Once | ORAL | Status: AC
  Administered 2012-12-08: 10 mg via ORAL
  Filled 2012-12-08: qty 1

## 2012-12-08 MED ORDER — RISPERIDONE 1 MG PO TABS
1.0000 mg | ORAL_TABLET | Freq: Two times a day (BID) | ORAL | Status: DC
  Filled 2012-12-08 (×6): qty 1

## 2012-12-08 MED ORDER — MIRTAZAPINE 15 MG PO TABS
15.0000 mg | ORAL_TABLET | Freq: Every day | ORAL | Status: DC
  Filled 2012-12-08 (×3): qty 1

## 2012-12-08 MED ORDER — PRAZOSIN HCL 2 MG PO CAPS
2.0000 mg | ORAL_CAPSULE | Freq: Every day | ORAL | Status: DC
  Administered 2012-12-08: 2 mg via ORAL
  Filled 2012-12-08: qty 1
  Filled 2012-12-08 (×2): qty 2

## 2012-12-08 MED ORDER — MIRTAZAPINE 15 MG PO TABS
7.5000 mg | ORAL_TABLET | Freq: Every day | ORAL | Status: DC
  Filled 2012-12-08 (×2): qty 0.5
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
## 2012-12-08 NOTE — Clinical Social Work Note (Signed)
[facility] Group Notes:  (Clinical Social Work)  12/08/2012  10:00-11:00AM  Summary of Progress/Problems:   The main focus of today's process group was to   identify the patient's current support system and decide on other supports that can be put in place to prevent future hospitalizations.  A handout was used to explain the 4 definitions/levels of support and to think about what support patient has given and received from others.  An emphasis was placed on using counselor, doctor, therapy groups, 12-step groups, and problem-specific support groups to expand supports. The patient was inattentive during the first part of group, and when called on, stated he was reading a book, specifically the workbook for today.  He asked what the group was talking about, and when told, he said "Well, I don't mean to be disrespectful but that's what we talked about yesterday."  Today's topic is, of course, different but a male cohort in the group encouraged him in this so CSW cut group short.  It has been brought to CSW's attention by MHT that at least one other male on the hall refuses to come to group because of this type of disruption and self-focus.  Type of Therapy:  Process Group with Motivational Interviewing  Participation Level:  Minimal  Participation Quality:  Inattentive  Affect:  Appropriate  Cognitive:  Oriented  Insight:  Limited  Engagement in Therapy:  None  Modes of Intervention:  Clarification, Education, Limit-setting, Problem-solving, Socialization, Support and Processing, Exploration, Discussion, Role-Play   Ambrose Mantle, LCSW 12/08/2012, 10:58 AM

## 2012-12-08 NOTE — Progress Notes (Signed)
Pt was told that he was unable to attend the evening AA meeting. Patients on 300 hall reported that they would not attend if he was going to attend. Patients reported this pt being disrespectful, inattentive, and disruptive.  The notes from the day support this.  Pt became loud and threatening to staff and throwing his beverage against the wall demanding to leave if he could not go to his group to get the treatment he needs.  Pt is not vested in treatment and was witnessed by this Clinical research associate exiting last nights AA meeting several times. Pt returned to the 500 hall where he paced and did push ups to calm himself. Pt wanted clarification as to why he was allowed to attend groups on 300 hall all day long but tonight he was not. This Clinical research associate informed the pt that it was reported that he was disruptive in groups today therefore would not be attending group tonight. The  AA meeting went smoothly with all patients undisturbed and thankful for a focused meeting.

## 2012-12-08 NOTE — Progress Notes (Signed)
Pt up at med window complaining of disruptive sleep. Reports near constant nightmares (about being killed and killing others) that make restful sleep impossible. Encouraged to speak with provider tomorrow. Note left on chart. Vistaril 25mg  prn given. Pt unsteady from effects of ambien therefore fall precautions reviewed and strongly encouraged. Blake Green

## 2012-12-08 NOTE — Progress Notes (Addendum)
Patient ID: Blake Green Xxx-[place], male   DOB: 08-Aug-1981, 32 y.o.   MRN: [id number] D. The patient started the shift by obsessing about being moved to the 500 hall. Made multiple derogatory remarks about several staff members and blamed others for his move to the new hall. Became agitated and verbally threatening when he was told he could not attend the evening AA group due to his disruptive behavior in the group earlier in the day.  A. Attempts made to engage the patient in a conversation and encourage him to attend the evening wrap up group on the 500 hall. Attempt made to review and offer new medication to help relieve his anxiety and agitation. R. The patient was too agitated to engage in a meaningful conversation and was not receptive to support at this time. Will give the patient time and space to try to settle down and will attempt to offer medication at that time.
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
## 2012-12-08 NOTE — Progress Notes (Signed)
Adult Psychoeducational Group Note  Date: 12/07/2012  Time: 11:00AM  Group Topic/Focus:  Healthy Communication: The focus of this group is to discuss communication, barriers to communication, as well as healthy ways to communicate with others.  Participation Level: Active  Participation Quality: Appropriate, Sharing and Supportive  Affect: Appropriate  Cognitive: Appropriate  Insight: Appropriate  Engagement in Group: Engaged and Supportive  Modes of Intervention: Education, Problem-solving and Support  Additional Comments: NONE  Blake Green M  12/08/2012, 1:05 PM    

## 2012-12-09 MED ORDER — PRAZOSIN HCL 2 MG PO CAPS: 2.0000 mg | ORAL_CAPSULE | Freq: Every day | ORAL | Status: DC

## 2012-12-09 NOTE — Progress Notes (Signed)
The patient was found in his bedroom approximately 30 minutes ago standing on top of his desk. The patient was holding a piece of paper up to his overhead light and was tracing a design. He complied by getting off of the desk, but returned when the nurse came down to his room.

## 2012-12-09 NOTE — Progress Notes (Signed)
Adult Psychoeducational Group Note  Date:  12/09/2012 Time:  12:05 PM  Group Topic/Focus:  Wellness Toolbox:   The focus of this group is to discuss various aspects of wellness, balancing those aspects and exploring ways to increase the ability to experience wellness.  Patients will create a wellness toolbox for use upon discharge.  Participation Level:  Did Not Attend   Additional Comments:  Pt did not attend group.  Sharyn Lull 12/09/2012, 12:05 PM

## 2012-12-09 NOTE — Progress Notes (Signed)
[facility] INPATIENT:  Family/Significant Other Suicide Prevention Education  Suicide Prevention Education:  Patient Refusal for Family/Significant Other Suicide Prevention Education: The patient Blake Green has refused to provide written consent for family/significant other to be provided Family/Significant Other Suicide Prevention Education during admission and/or prior to discharge.  Physician notified. Writer provided suicide prevention education directly to patient at 10:55 AM; conversation included risk factors, warning signs and resources to contact for help. Mobile crisis services explained and contact card placed in chart for pt to receive at discharge.  Clide Dales 12/09/2012, 4:16 PM

## 2012-12-09 NOTE — Progress Notes (Signed)
[facility] Adult Case Management Discharge Plan :  Will you be returning to the same living situation after discharge: Yes,  home with significant other At discharge, do you have transportation home?:Yes,  significant other  Do you have the ability to pay for your medications:Yes,  self pay as per patient report  Release of information consent forms completed and in the chart;  Patient's signature needed at discharge.  Patient to Follow up at: Follow-up Information   Follow up with [facility] On 12/16/2012. (Appointment is at 11AM on Monday March 31st)    Contact information:   [address] [place]  [address] [facility] [phone] FAX       Patient denies SI/HI:   Yes,  denies both    Safety Planning and Suicide Prevention discussed:  Yes,  with patient one to one.   Clide Dales 12/09/2012, 4:11 PM

## 2012-12-09 NOTE — [facility] Suicide Risk Assessment (Signed)
Suicide Risk Assessment  Discharge Assessment     Demographic Factors:  Male, Caucasian, Low socioeconomic status and Unemployed  Mental Status Per Nursing Assessment::   On Admission:  NA  Current Mental Status by Physician: Patient alert and oriented to 4. Denies aH/VH/SI/HI.  Loss Factors: Financial problems/change in socioeconomic status  Historical Factors: Family history of mental illness or substance abuse and Impulsivity  Risk Reduction Factors:   Positive coping skills or problem solving skills  Continued Clinical Symptoms:  Alcohol/Substance Abuse/Dependencies  Cognitive Features That Contribute To Risk:  Thought constriction (tunnel vision)    Suicide Risk:  Minimal: No identifiable suicidal ideation.  Patients presenting with no risk factors but with morbid ruminations; may be classified as minimal risk based on the severity of the depressive symptoms  Discharge Diagnoses:   AXIS I:  Substance Abuse and Substance Induced Mood Disorder AXIS II:  Deferred AXIS III:   Past Medical History  Diagnosis Date  . HOH (hard of hearing)   . Neck pain   . Meningitis    AXIS IV:  other psychosocial or environmental problems AXIS V:  61-70 mild symptoms  Plan Of Care/Follow-up recommendations:  Activity:  as tolerated Diet:  regular Follow up with outpatient appointments.  Is patient on multiple antipsychotic therapies at discharge:  No   Has Patient had three or more failed trials of antipsychotic monotherapy by history:  No  Recommended Plan for Multiple Antipsychotic Therapies: NA  Arayah Krouse 12/09/2012, 10:28 AM

## 2012-12-09 NOTE — Progress Notes (Signed)
Patient was discharged, family to pick him up.  Patient called his mother who had his belongings, so patient did not have any items in a locker when he was discharged.  Patient denied SI and HI.   Denied A/V hallucinations.  Denied pain.  Patient stated he appreciated all assistance received from [facility] staff.  Patient's room had been searched in the morning for possible medications but no medications were found in his room.  Patient was given suicide prevention information, which was discussed with patient, who stated he understood and had no questions.

## 2012-12-09 NOTE — Progress Notes (Signed)
Patient refused risperdal 1 mg this morning.   Stated he wanted his subutex 8 mg.  Nurse went to pyxis on 400 hall.  Patient was in phone room and he came to nurse's station for subutex.  Another patient told MHT that she thought she saw this patient put his medication in his pocket.  MHT told nurse who informed MD and DON.  DON talked to patient and he denied taking subutex and putting med in his pocket.  Nurse searched patient's room, patient took items out of his pockets for DON.  Patient stated he was ready to leave, that he had a job in [place], 4 children to take care of.  Patient was discharged.   Patient denied SI and HI.   Denied A/V hallucinations.  Denied pain.  Medications administered per MD order. Safety checks every 15 minutes per policy.
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
## 2012-12-09 NOTE — Tx Team (Signed)
Interdisciplinary Treatment Plan Update   Date Reviewed:  12/09/2012  Time Reviewed:  10:16 AM  Progress in Treatment:   Attending groups: Yes Participating in groups: Yes Taking medication as prescribed: Yes  Tolerating medication: Yes Family/Significant other contact made: Yes, contact made with mother by 300 Product/process development scientist. Patient understands diagnosis: Yes  Discussing patient identified problems/goals with staff: Yes Medical problems stabilized or resolved: Yes Denies suicidal/homicidal ideation: Yes Patient has not harmed self or others: Yes  For review of initial/current patient goals, please see plan of care.  Estimated Length of Stay:  Discharge home today.  Reasons for Continued Hospitalization:   New Problems/Goals identified:    Discharge Plan or Barriers:   Home with outpatient follow up at [facility].  Additional Comments:  Patient reports doing well and denies SI/HI.  He rates depression and anxiety at zero.  He is looking forward to discharging home today.  Patient to follow up with [facility] on 12/16/12.  Attendees:  Patient:  Blake Green 12/09/2012 10:16 AM   Signature: Patrick North, MD 12/09/2012 10:16 AM  Signature: 12/09/2012 10:16 AM  Signature: 12/09/2012 10:16 AM  Signature: 12/09/2012 10:16 AM  Signature:   12/09/2012 10:16 AM  Signature:  Juline Patch, LCSW 12/09/2012 10:16 AM  Signature:  12/09/2012 10:16 AM  Signature:  12/09/2012 10:16 AM  Signature: 12/09/2012 10:16 AM  Signature:    Signature:    Signature:      Scribe for Treatment Team:   Juline Patch,  12/09/2012 10:16 AM

## 2012-12-10 MED ORDER — BUPRENORPHINE HCL-NALOXONE HCL 8-2 MG SL SUBL: 1.0000 | SUBLINGUAL_TABLET | Freq: Two times a day (BID) | SUBLINGUAL | Status: DC
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
## 2012-12-12 NOTE — Progress Notes (Signed)
Patient Discharge Instructions:  Records sent via mail to: [facility] [address] [place], [place] [address]  Jerelene Redden, 12/12/2012, 1:50 PM

## 2012-12-18 ENCOUNTER — Encounter (HOSPITAL_COMMUNITY): Payer: Self-pay

## 2012-12-18 ENCOUNTER — Ambulatory Visit (HOSPITAL_COMMUNITY)
Admission: RE | Admit: 2012-12-18 | Discharge: 2012-12-18 | Disposition: A | Discharge status: Home / Self Care | Payer: Self-pay | Attending: Psychiatry | Admitting: Psychiatry

## 2012-12-18 ENCOUNTER — Encounter (HOSPITAL_COMMUNITY): Payer: Self-pay | Admitting: Licensed Clinical Social Worker

## 2012-12-18 ENCOUNTER — Emergency Department (HOSPITAL_COMMUNITY)
Admission: EM | Admit: 2012-12-18 | Discharge: 2012-12-19 | Disposition: A | Discharge status: Home / Self Care | Payer: Self-pay | Attending: Emergency Medicine | Admitting: Emergency Medicine

## 2012-12-18 DIAGNOSIS — F1410 Cocaine abuse, uncomplicated: Secondary | ICD-10-CM | POA: Insufficient documentation

## 2012-12-18 DIAGNOSIS — G4700 Insomnia, unspecified: Secondary | ICD-10-CM | POA: Insufficient documentation

## 2012-12-18 DIAGNOSIS — F17200 Nicotine dependence, unspecified, uncomplicated: Secondary | ICD-10-CM | POA: Insufficient documentation

## 2012-12-18 DIAGNOSIS — Z8669 Personal history of other diseases of the nervous system and sense organs: Secondary | ICD-10-CM | POA: Insufficient documentation

## 2012-12-18 DIAGNOSIS — R45851 Suicidal ideations: Secondary | ICD-10-CM | POA: Insufficient documentation

## 2012-12-18 DIAGNOSIS — Z8661 Personal history of infections of the central nervous system: Secondary | ICD-10-CM | POA: Insufficient documentation

## 2012-12-18 LAB — CBC WITH DIFFERENTIAL/PLATELET
Basophils Absolute: 0 10*3/uL (ref 0.0–0.1)
Eosinophils Absolute: 0 10*3/uL (ref 0.0–0.7)
Eosinophils Relative: 0 % (ref 0–5)
HCT: 41.9 % (ref 39.0–52.0)
Lymphocytes Relative: 20 % (ref 12–46)
MCH: 27.4 pg (ref 26.0–34.0)
MCV: 80.9 fL (ref 78.0–100.0)
Monocytes Absolute: 0.7 10*3/uL (ref 0.1–1.0)
Platelets: 270 10*3/uL (ref 150–400)
RDW: 13.2 % (ref 11.5–15.5)

## 2012-12-18 LAB — RAPID URINE DRUG SCREEN, HOSP PERFORMED
Amphetamines: POSITIVE — AB
Opiates: POSITIVE — AB
Tetrahydrocannabinol: NOT DETECTED

## 2012-12-18 LAB — BASIC METABOLIC PANEL
CO2: 23 mEq/L (ref 19–32)
Calcium: 9.5 mg/dL (ref 8.4–10.5)
Creatinine, Ser: 1.21 mg/dL (ref 0.50–1.35)
GFR calc non Af Amer: 79 mL/min — ABNORMAL LOW (ref >90)
Glucose, Bld: 103 mg/dL — ABNORMAL HIGH (ref 70–99)
Sodium: 141 mEq/L (ref 135–145)

## 2012-12-18 LAB — ETHANOL: Alcohol, Ethyl (B): <11 mg/dL (ref 0–11)

## 2012-12-18 MED ORDER — ONDANSETRON HCL 4 MG PO TABS: 4.0000 mg | ORAL_TABLET | Freq: Three times a day (TID) | ORAL | Status: DC | PRN

## 2012-12-18 MED ORDER — DICYCLOMINE HCL 20 MG PO TABS: 20.0000 mg | ORAL_TABLET | Freq: Four times a day (QID) | ORAL | Status: DC | PRN

## 2012-12-18 MED ORDER — ALUM & MAG HYDROXIDE-SIMETH 200-200-20 MG/5ML PO SUSP
30.0000 mL | ORAL | Status: DC | PRN
  Administered 2012-12-19: 30 mL via ORAL
  Filled 2012-12-18: qty 30

## 2012-12-18 MED ORDER — ACETAMINOPHEN 325 MG PO TABS: 650.0000 mg | ORAL_TABLET | ORAL | Status: DC | PRN

## 2012-12-18 MED ORDER — BUPRENORPHINE HCL 2 MG SL SUBL
2.0000 mg | SUBLINGUAL_TABLET | Freq: Two times a day (BID) | SUBLINGUAL | Status: DC
  Administered 2012-12-18 – 2012-12-19 (×2): 2 mg via SUBLINGUAL
  Filled 2012-12-18 (×2): qty 1

## 2012-12-18 MED ORDER — ONDANSETRON 4 MG PO TBDP: 4.0000 mg | ORAL_TABLET | Freq: Four times a day (QID) | ORAL | Status: DC | PRN

## 2012-12-18 MED ORDER — LOPERAMIDE HCL 2 MG PO CAPS: 2.0000 mg | ORAL_CAPSULE | ORAL | Status: DC | PRN

## 2012-12-18 MED ORDER — NAPROXEN 500 MG PO TABS: 500.0000 mg | ORAL_TABLET | Freq: Two times a day (BID) | ORAL | Status: DC | PRN

## 2012-12-18 MED ORDER — HYDROXYZINE HCL 25 MG PO TABS: 25.0000 mg | ORAL_TABLET | Freq: Four times a day (QID) | ORAL | Status: DC | PRN

## 2012-12-18 MED ORDER — NICOTINE 21 MG/24HR TD PT24
21.0000 mg | MEDICATED_PATCH | Freq: Every day | TRANSDERMAL | Status: DC
  Administered 2012-12-18: 21 mg via TRANSDERMAL
  Filled 2012-12-18: qty 1

## 2012-12-18 MED ORDER — ZOLPIDEM TARTRATE 5 MG PO TABS
5.0000 mg | ORAL_TABLET | Freq: Every evening | ORAL | Status: DC | PRN
  Administered 2012-12-18: 5 mg via ORAL
  Filled 2012-12-18: qty 1

## 2012-12-18 MED ORDER — METHOCARBAMOL 500 MG PO TABS: 500.0000 mg | ORAL_TABLET | Freq: Three times a day (TID) | ORAL | Status: DC | PRN

## 2012-12-18 MED ORDER — IBUPROFEN 600 MG PO TABS: 600.0000 mg | ORAL_TABLET | Freq: Three times a day (TID) | ORAL | Status: DC | PRN

## 2012-12-18 MED ORDER — PRAZOSIN HCL 2 MG PO CAPS
2.0000 mg | ORAL_CAPSULE | Freq: Every day | ORAL | Status: DC
  Administered 2012-12-18: 2 mg via ORAL
  Filled 2012-12-18 (×3): qty 1

## 2012-12-18 NOTE — ED Notes (Signed)
Pt from [facility] for medical clearance

## 2012-12-18 NOTE — ED Provider Notes (Signed)
History  This chart was scribed for non-physician practitioner working with Laray Anger, DO by Ardeen Jourdain, ED Scribe. This patient was seen in room WTR5/WTR5 and the patient's care was started at 2008.  CSN: [id number]  Arrival date & time 12/18/12  4098   First MD Initiated Contact with Patient 12/18/12 2008      No chief complaint on file.    The history is provided by the patient. No language interpreter was used.    Blake Green is a 32 y.o. male who presents to the Emergency Department complaining of needing detox from cocaine with associated SI. He states he has been using cocaine for the past 3 days and has not been able to sleep or eat. He states he is "upset with people" but denies HI. He reports having a plan for suicide. He states he was "going to take so much crack he would die."  Pt has been recently released from a [facility] treatment but states he was only able to last a few days without relapsing. He denies any other substance abuse. Pt is currently receiving Suboxone treatments.   Past Medical History  Diagnosis Date  . HOH (hard of hearing)   . Neck pain   . Meningitis     Past Surgical History  Procedure Laterality Date  . Skin graft      No family history on file.  History  Substance Use Topics  . Smoking status: Current Every Day Smoker -- 0.50 packs/day    Types: Cigarettes  . Smokeless tobacco: Not on file  . Alcohol Use: No      Review of Systems  Constitutional: Negative for fever and chills.  Respiratory: Negative for shortness of breath.   Gastrointestinal: Negative for nausea and vomiting.  Neurological: Negative for weakness.  Psychiatric/Behavioral:       Medical Clearance   All other systems reviewed and are negative.    Allergies  Tylenol  Home Medications   Current Outpatient Rx  Name  Route  Sig  Dispense  Refill  . buprenorphine-naloxone (SUBOXONE) 8-2 MG SUBL   Sublingual   Place 1 tablet under the  tongue 2 (two) times daily.   1 tablet   0   . prazosin (MINIPRESS) 2 MG capsule   Oral   Take 2 mg by mouth at bedtime.           Triage Vitals: BP 120/70  Pulse 91  Temp(Src) 97.8 F (36.6 C) (Oral)  Resp 18  SpO2 96%  Physical Exam  Nursing note and vitals reviewed. Constitutional: He is oriented to person, place, and time. He appears well-developed and well-nourished. No distress.  HENT:  Head: Normocephalic and atraumatic.  Eyes: EOM are normal. Pupils are equal, round, and reactive to light.  Neck: Normal range of motion. Neck supple. No tracheal deviation present.  Cardiovascular: Normal rate.   Pulmonary/Chest: Effort normal. No respiratory distress.  Abdominal: Soft. He exhibits no distension.  Musculoskeletal: Normal range of motion. He exhibits no edema.  Neurological: He is alert and oriented to person, place, and time.  Skin: Skin is warm and dry.  Psychiatric: He has a normal mood and affect. His behavior is normal. He expresses suicidal ideation. He expresses no homicidal (does admits to thoughts of harming others but not killing someone else) ideation. He expresses no suicidal plans and no homicidal plans.    ED Course  Procedures (including critical care time)  DIAGNOSTIC STUDIES: Oxygen Saturation is 96%  on room air, adequate by my interpretation.    COORDINATION OF CARE:  8:37 PM: Discussed treatment plan which includes CBC, BMP, EtOH and urine rapid drug screen with pt at bedside and pt agreed to plan.    Labs Reviewed  CBC WITH DIFFERENTIAL  BASIC METABOLIC PANEL  ETHANOL  URINE RAPID DRUG SCREEN (HOSP PERFORMED)   No results found.   1. Cocaine abuse   2. Insomnia   3. Suicidal ideation       MDM  ACT consulted, holding orders placed, labs drawn.   Dorthula Matas, PA-C 12/18/12 2043

## 2012-12-18 NOTE — BH Assessment (Signed)
Assessment Note   Blake Green is an 32 y.o. male, single, white who presents to [facility] requesting inpatient treatment for substance abuse and depression. Pt was discharged from [facility] on 12/09/2012 and relapsed a couple of days later. He reports he is using I.V. Cocaine, Seroquel, Ambien and Suboxone. He states his family has given up on him. He has multiple legal charges for selling drugs and larceny. He states "I have about 15 charges" but he cannot remember the dates of his scheduled court appearances. He reports suicidal ideation with thoughts of overdosing on cocaine or other substances and thoughts of hanging himself. He has a history of suicide attempts including cutting his wrist, overdosing and attempting to hang himself. Pt denies homicidal ideation but says he would not mind retaliating against "people who have used me." He denies any psychotic symptoms. He cannot identify any particular reason he is seeking treatment today other than he needs to get his life on track. He reports he has financial problems, legal problems and has alienated all his family and friends.   Pt cannot contract for safety at this time outside the hospital. He states he wants to go to a long-term treatment center after he is stabilized.    Axis I: Opioid Dependence, Cocaine Abuse, PTSD, Impulse Control NOS, Substance Induced Mood Disorder Axis II: Deferred Axis III:  Past Medical History  Diagnosis Date  . HOH (hard of hearing)   . Neck pain   . Meningitis    Axis IV: economic problems, occupational problems, other psychosocial or environmental problems, problems related to legal system/crime, problems related to social environment and problems with primary support group Axis V: GAF=30  Past Medical History:  Past Medical History  Diagnosis Date  . HOH (hard of hearing)   . Neck pain   . Meningitis     Past Surgical History  Procedure Laterality Date  . Skin graft      Family  History: No family history on file.  Social History:  reports that he has been smoking Cigarettes.  He has been smoking about 0.50 packs per day. He does not have any smokeless tobacco history on file. He reports that he uses illicit drugs (Cocaine and Other-see comments) about 3 times per week. He reports that he does not drink alcohol.  Additional Social History:  Alcohol / Drug Use Pain Medications: Denies Prescriptions: Suboxone Over the Counter: Denies History of alcohol / drug use?: Yes Longest period of sobriety (when/how long): 1 year Negative Consequences of Use: Financial;Legal;Personal relationships;Work / Programmer, multimedia Withdrawal Symptoms:  (Denies current withdrawal symptoms) Substance #1 Name of Substance 1: Cocaine (I.V.) 1 - Age of First Use: 29 1 - Amount (size/oz): $200 worth 1 - Frequency: Reports using for past 2 days 1 - Duration: 2 years 1 - Last Use / Amount: 12/17/2012 Substance #2 Name of Substance 2: Suboxone 2 - Age of First Use: 27 2 - Amount (size/oz): Pt reports taking medication as prescribed 2 - Frequency: Pt reports taking medictaion as prescribed 2 - Duration: On and off for 5 years 2 - Last Use / Amount: 12/17/2012  CIWA:   COWS:    Allergies:  Allergies  Allergen Reactions  . Tylenol (Acetaminophen) Rash    Home Medications:  (Not in a hospital admission)  OB/GYN Status:  No LMP for male patient.  General Assessment Data Location of Assessment: [facility] Assessment Services Living Arrangements: Spouse/significant other;Children Can pt return to current living arrangement?: Yes Admission Status:  Voluntary Is patient capable of signing voluntary admission?: Yes Transfer from: Home Referral Source: Self/Family/Friend  Education Status Is patient currently in school?: No Current Grade: NA Highest grade of school patient has completed: 10th Name of school: NA Contact person: NA  Risk to self Suicidal Ideation: Yes-Currently Present Suicidal  Intent: No Is patient at risk for suicide?: Yes Suicidal Plan?: Yes-Currently Present Specify Current Suicidal Plan: Overdose or hang himself Access to Means: Yes Specify Access to Suicidal Means: Access to multiple medications and household items What has been your use of drugs/alcohol within the last 12 months?: Pt reports abusing cocaine I.V. and other medications Previous Attempts/Gestures: Yes How many times?: 2 (2 prior suicide attemtps-cut wrist & hung self) Other Self Harm Risks: States his drug use is out of control Triggers for Past Attempts: Unknown Intentional Self Injurious Behavior: None Family Suicide History: No Recent stressful life event(s): Financial Problems;Legal Issues Persecutory voices/beliefs?: No Depression: Yes Depression Symptoms: Despondent;Insomnia;Fatigue;Guilt;Feeling worthless/self pity;Feeling angry/irritable Substance abuse history and/or treatment for substance abuse?: Yes Suicide prevention information given to non-admitted patients: Not applicable  Risk to Others Homicidal Ideation: No Thoughts of Harm to Others: No Current Homicidal Intent: No Current Homicidal Plan: No Access to Homicidal Means: No Identified Victim: None History of harm to others?: No Assessment of Violence: None Noted Violent Behavior Description: None Does patient have access to weapons?: No Criminal Charges Pending?: Yes Describe Pending Criminal Charges: Multiple drug and larceny charges Does patient have a court date: Yes Court Date: 12/18/12 (Pt doesn't know dates)  Psychosis Hallucinations: None noted Delusions: None noted  Mental Status Report Appear/Hygiene: Other (Comment) (Casually dressed) Eye Contact: Good Motor Activity: Unremarkable Speech: Logical/coherent Level of Consciousness: Alert Mood: Depressed Affect: Depressed Anxiety Level: None Thought Processes: Coherent;Relevant Judgement: Unimpaired Orientation:  Person;Place;Time;Situation Obsessive Compulsive Thoughts/Behaviors: None  Cognitive Functioning Concentration: Normal Memory: Recent Intact;Remote Intact IQ: Average Insight: Fair Impulse Control: Fair Appetite: Poor Weight Loss: 5 Weight Gain: 0 Sleep: Decreased Total Hours of Sleep: 3 (Reports little to no sleep in two days) Vegetative Symptoms: None  ADLScreening [facility] Assessment Services) Patient's cognitive ability adequate to safely complete daily activities?: Yes Patient able to express need for assistance with ADLs?: Yes Independently performs ADLs?: Yes (appropriate for developmental age)  Abuse/Neglect [facility]) Physical Abuse: Denies Verbal Abuse: Denies Sexual Abuse: Denies  Prior Inpatient Therapy Prior Inpatient Therapy: Yes Prior Therapy Dates: 12/09/2012; multiple admits Prior Therapy Facilty/Provider(s): [facility]; various facilties Reason for Treatment: Substance abuse  Prior Outpatient Therapy Prior Outpatient Therapy: Yes Prior Therapy Dates: Current Prior Therapy Facilty/Provider(s): Dr. Barnett Abu Reason for Treatment: Medication management  ADL Screening (condition at time of admission) Patient's cognitive ability adequate to safely complete daily activities?: Yes Patient able to express need for assistance with ADLs?: Yes Independently performs ADLs?: Yes (appropriate for developmental age) Weakness of Legs: None Weakness of Arms/Hands: None  Home Assistive Devices/Equipment Home Assistive Devices/Equipment: None    Abuse/Neglect Assessment (Assessment to be complete while patient is alone) Physical Abuse: Denies Verbal Abuse: Denies Sexual Abuse: Denies Exploitation of patient/patient's resources: Denies Self-Neglect: Denies     Merchant navy officer (For Healthcare) Advance Directive: Patient does not have advance directive;Patient would not like information Pre-existing out of facility DNR order (yellow form or pink MOST form):  No Nutrition Screen- [facility] Adult/WL/AP Patient's home diet: Regular Have you recently lost weight without trying?: No Have you been eating poorly because of a decreased appetite?: No Malnutrition Screening Tool Score: 0  Additional Information 1:1 In  Past 12 Months?: No CIRT Risk: No Elopement Risk: No Does patient have medical clearance?: No     Disposition:  Disposition Initial Assessment Completed for this Encounter: Yes Disposition of Patient: Other dispositions Other disposition(s): Other (Comment) (Transfer to [facility] for medical clearance)  On Site Evaluation by:   Reviewed with Physician: Donell Sievert, PA  Confirmed with Thurman Coyer, [facility] that no beds are currently available due to infection control. Consulted with Donell Sievert, PA who recommended Pt be transferred to [facility] for medical clearance and further evaluation. Pt is not accepted nor declined. Pt agreed to transfer to [facility] for medical clearance. Contacted Tammi, Consulting civil engineer at [employer] and gave report. Pt transported to [facility] via security and [facility] staff.    Harlin Rain Patsy Baltimore, [facility], [facility] Assessment Counselor    Pamalee Leyden 12/18/2012 9:17 PM

## 2012-12-18 NOTE — ED Notes (Signed)
2 bags placed in locker 38

## 2012-12-18 NOTE — ED Notes (Signed)
[facility], states past 2 days has been smoking and "shooting up" cocaine/crack, states he has not been able to sleep, states he needs his Remus Loffler, states he SI. Pt cooperative at this time.

## 2012-12-19 MED ORDER — ZOLPIDEM TARTRATE 5 MG PO TABS
5.0000 mg | ORAL_TABLET | Freq: Every evening | ORAL | Status: DC | PRN
Start: 2012-12-19 — End: 2013-02-13

## 2012-12-19 NOTE — Treatment Plan (Signed)
Blake Green has been declined admission to [facility] secondary to threatening and disruptive behavior on our unit the last time he was here which was only last week.  Please see below notes from pt's last discharge summary.  "[name] was initially on the 300 hall for detox but was moved to the 500 hall once he was caught smoking (contraband) on 300. He attended some groups but was inattentive or left frequently or disruptive. [name] threatened to go off when his demands were not met, like going to the AA group on 300 hall--denied group because of interruptive, disruptive behavior."

## 2012-12-19 NOTE — ED Provider Notes (Addendum)
Filed Vitals:   12/19/12 0550  BP: 114/68  Pulse: 86  Temp: 97.8 F (36.6 C)  Resp: 20    Assuming care of patient this morning. Patient in the ED for detox and suicidal ideations. Awaiting firnal recs from ACT team. Workup thus far is negative. Patient had no complains, no concerns from the nursing side. Will continue to monitor.   Derwood Kaplan, MD 12/19/12 0823  11:21 AM Telepsych has cleared the patient. I spoke with patient, and he has no active suicidal ideations, but he states that he might do enough cocaine to kill himself. I spoke with his fiance, and addressed the tele psych recommendations. She thinks that patient is not stable on his current meds. He has an outpatient Psychiatrist - and since patient told me that he has ability to buy hundreds of dollar worth of cocaine, i have asked him to use those means to see the Psychiatrist. I also spoke with the RNs and even Dr. Shela Commons - and they have assessed and evaluated patient enough times, where they know him by name. They endorse that the telepsych recs are appropriate.  I will discharge the patient at this time.  Derwood Kaplan, MD 12/19/12 1128

## 2012-12-19 NOTE — Progress Notes (Signed)
During [facility] ED 12/19/12 visit CM spoke with pt who confirms self pay [facility] resident (presently wanting to move from [place] to [place]) with no pcp. CM discussed and provided written information for self pay pcps, importance of pcp for f/u care, www.needymeds.org, discounted pharmacies, MATCH program and other [place] resources such as financial assistance, DSS and  health department Reviewed Health connect number to assist with finding self pay provider close to pt's residence. Reviewed resources for [employer] self pay pcps like [facility], family medicine at [address], [facility] family practice, general medical clinics, [facility] urgent care plus others, [facility] out patient pharmacies and housing Pt voiced understanding and appreciation of resources provided  Pt voiced frustration when he inquired about Rx for [employer] and another medication that ED psychiatrist and EDP both prefers pt receive these Rx from a pcp (from self pay list offered) Pt states he will seek pcp Observed leaving psych ED with CNA/tech without aggressive behavior

## 2012-12-19 NOTE — ED Provider Notes (Signed)
Medical screening examination/treatment/procedure(s) were performed by non-physician practitioner and as supervising physician I was immediately available for consultation/collaboration.   Laray Anger, DO 12/19/12 2041

## 2013-02-13 ENCOUNTER — Encounter (HOSPITAL_BASED_OUTPATIENT_CLINIC_OR_DEPARTMENT_OTHER): Payer: Self-pay

## 2013-02-13 ENCOUNTER — Emergency Department (HOSPITAL_BASED_OUTPATIENT_CLINIC_OR_DEPARTMENT_OTHER)
Admission: EM | Admit: 2013-02-13 | Discharge: 2013-02-13 | Disposition: A | Discharge status: Home / Self Care | Payer: Self-pay | Attending: Emergency Medicine | Admitting: Emergency Medicine

## 2013-02-13 DIAGNOSIS — F17200 Nicotine dependence, unspecified, uncomplicated: Secondary | ICD-10-CM | POA: Insufficient documentation

## 2013-02-13 DIAGNOSIS — H9190 Unspecified hearing loss, unspecified ear: Secondary | ICD-10-CM | POA: Insufficient documentation

## 2013-02-13 DIAGNOSIS — L739 Follicular disorder, unspecified: Secondary | ICD-10-CM

## 2013-02-13 DIAGNOSIS — Z8661 Personal history of infections of the central nervous system: Secondary | ICD-10-CM | POA: Insufficient documentation

## 2013-02-13 DIAGNOSIS — L738 Other specified follicular disorders: Secondary | ICD-10-CM | POA: Insufficient documentation

## 2013-02-13 DIAGNOSIS — Z79899 Other long term (current) drug therapy: Secondary | ICD-10-CM | POA: Insufficient documentation

## 2013-02-13 MED ORDER — DOXYCYCLINE HYCLATE 100 MG PO CAPS: 100.0000 mg | ORAL_CAPSULE | Freq: Two times a day (BID) | ORAL | Status: DC

## 2013-02-13 MED ORDER — OXYCODONE HCL 5 MG PO TABS: 5.0000 mg | ORAL_TABLET | Freq: Four times a day (QID) | ORAL | Status: DC | PRN

## 2013-02-13 NOTE — ED Notes (Signed)
Abscess to right axilla x 3 days 

## 2013-02-13 NOTE — ED Provider Notes (Signed)
History     CSN: [id number]  Arrival date & time 02/13/13  1451   First MD Initiated Contact with Patient 02/13/13 [phone]      Chief Complaint  Patient presents with  . Abscess    (Consider location/radiation/quality/duration/timing/severity/associated sxs/prior treatment) Patient is a 32 y.o. male presenting with abscess.  Abscess  Pt reports 4 days of increasing pain and swelling, areas of abscess in his R axilla. Pain is moderate to severe, aching worse with palpation and movement. He has not had abscesses in this location before. Denies fever, no drainage.   Past Medical History  Diagnosis Date  . HOH (hard of hearing)   . Neck pain   . Meningitis     Past Surgical History  Procedure Laterality Date  . Skin graft      No family history on file.  History  Substance Use Topics  . Smoking status: Current Every Day Smoker -- 0.50 packs/day    Types: Cigarettes  . Smokeless tobacco: Not on file  . Alcohol Use: Yes      Review of Systems All other systems reviewed and are negative except as noted in HPI.   Allergies  Tylenol  Home Medications   Current Outpatient Rx  Name  Route  Sig  Dispense  Refill  . buprenorphine-naloxone (SUBOXONE) 8-2 MG SUBL   Sublingual   Place 1 tablet under the tongue 2 (two) times daily.   1 tablet   0   . prazosin (MINIPRESS) 2 MG capsule   Oral   Take 2 mg by mouth at bedtime.         Marland Kitchen zolpidem (AMBIEN) 5 MG tablet   Oral   Take 1 tablet (5 mg total) by mouth at bedtime as needed for sleep.   5 tablet   0     BP 124/84  Pulse 100  Temp(Src) 98.2 F (36.8 C) (Oral)  Resp 16  Ht 6' (1.829 m)  Wt 200 lb (90.719 kg)  BMI 27.12 kg/m2  SpO2 97%  Physical Exam  Constitutional: He is oriented to person, place, and time. He appears well-developed and well-nourished.  HENT:  Head: Normocephalic and atraumatic.  Neck: Neck supple.  Pulmonary/Chest: Effort normal.  Musculoskeletal:  Several small 1cm areas of  induration, erythema and tenderness in R axilla without focal fluctuance  Neurological: He is alert and oriented to person, place, and time. No cranial nerve deficit.  Psychiatric: He has a normal mood and affect. His behavior is normal.    ED Course  Procedures (including critical care time)  Labs Reviewed - No data to display No results found.   1. Folliculitis       MDM  Pt with cellulitis/folliculitis of R axilla but no discrete abscess. Abx, pain meds and recheck if worsens.        [name], MD 02/13/13 1521

## 2013-02-13 NOTE — ED Notes (Signed)
MD at bedside. 

## 2013-02-19 ENCOUNTER — Emergency Department (HOSPITAL_BASED_OUTPATIENT_CLINIC_OR_DEPARTMENT_OTHER)
Admission: EM | Admit: 2013-02-19 | Discharge: 2013-02-19 | Disposition: A | Discharge status: Home / Self Care | Payer: Self-pay | Attending: Emergency Medicine | Admitting: Emergency Medicine

## 2013-02-19 ENCOUNTER — Encounter (HOSPITAL_BASED_OUTPATIENT_CLINIC_OR_DEPARTMENT_OTHER): Payer: Self-pay

## 2013-02-19 ENCOUNTER — Emergency Department (HOSPITAL_BASED_OUTPATIENT_CLINIC_OR_DEPARTMENT_OTHER): Payer: Self-pay

## 2013-02-19 DIAGNOSIS — Z8669 Personal history of other diseases of the nervous system and sense organs: Secondary | ICD-10-CM | POA: Insufficient documentation

## 2013-02-19 DIAGNOSIS — Y9389 Activity, other specified: Secondary | ICD-10-CM | POA: Insufficient documentation

## 2013-02-19 DIAGNOSIS — S6991XA Unspecified injury of right wrist, hand and finger(s), initial encounter: Secondary | ICD-10-CM

## 2013-02-19 DIAGNOSIS — Z8739 Personal history of other diseases of the musculoskeletal system and connective tissue: Secondary | ICD-10-CM | POA: Insufficient documentation

## 2013-02-19 DIAGNOSIS — S6990XA Unspecified injury of unspecified wrist, hand and finger(s), initial encounter: Secondary | ICD-10-CM | POA: Insufficient documentation

## 2013-02-19 DIAGNOSIS — H9190 Unspecified hearing loss, unspecified ear: Secondary | ICD-10-CM | POA: Insufficient documentation

## 2013-02-19 DIAGNOSIS — Y9289 Other specified places as the place of occurrence of the external cause: Secondary | ICD-10-CM | POA: Insufficient documentation

## 2013-02-19 DIAGNOSIS — F17200 Nicotine dependence, unspecified, uncomplicated: Secondary | ICD-10-CM | POA: Insufficient documentation

## 2013-02-19 DIAGNOSIS — W230XXA Caught, crushed, jammed, or pinched between moving objects, initial encounter: Secondary | ICD-10-CM | POA: Insufficient documentation

## 2013-02-19 MED ORDER — OXYCODONE HCL 5 MG PO TABS
5.0000 mg | ORAL_TABLET | Freq: Once | ORAL | Status: AC
  Administered 2013-02-19: 5 mg via ORAL
  Filled 2013-02-19: qty 1

## 2013-02-19 NOTE — ED Notes (Signed)
Britta Mccreedy, PA-C at bedside.

## 2013-02-19 NOTE — ED Notes (Signed)
Pt states that he had a crush injury to his R little finger while looking at a box truck today.  Pt states that he believes the finger is broken distal to the knuckle.  Denies any other injuries.

## 2013-02-19 NOTE — ED Provider Notes (Signed)
Medical screening examination/treatment/procedure(s) were performed by non-physician practitioner and as supervising physician I was immediately available for consultation/collaboration.   Glynn Octave, MD 02/19/13 1807

## 2013-02-19 NOTE — ED Provider Notes (Signed)
History     CSN: [id number]  Arrival date & time 02/19/13  1623   First MD Initiated Contact with Patient 02/19/13 1628      Chief Complaint  Patient presents with  . Finger Injury    (Consider location/radiation/quality/duration/timing/severity/associated sxs/prior treatment) Patient is a 32 y.o. male presenting with hand injury. The history is provided by the patient.  Hand Injury Upper extremity pain location: distal phalynx of 5th digit of right hand. Time since incident: PTA. Injury: yes   Mechanism of injury: crush   Crush injury:    Mechanism: rolling door of moving truck closed on it. Pain details:    Quality:  Throbbing and sharp   Radiates to:  Does not radiate   Severity:  Severe   Onset quality:  Sudden   Duration: an hour.   Timing:  Constant   Progression:  Unchanged Chronicity:  New Handedness:  Right-handed Dislocation: no   Foreign body present:  No foreign bodies Prior injury to area:  No Relieved by:  None tried Worsened by:  Movement Ineffective treatments:  None tried Associated symptoms: swelling   Associated symptoms: no back pain, no neck pain, no numbness and no tingling   Risk factors: no known bone disorder     Past Medical History  Diagnosis Date  . HOH (hard of hearing)   . Neck pain   . Meningitis     Past Surgical History  Procedure Laterality Date  . Skin graft      History reviewed. No pertinent family history.  History  Substance Use Topics  . Smoking status: Current Every Day Smoker -- 0.50 packs/day    Types: Cigarettes  . Smokeless tobacco: Not on file  . Alcohol Use: Yes      Review of Systems  HENT: Negative for neck pain.   Musculoskeletal: Negative for back pain.    Allergies  Tylenol  Home Medications   Current Outpatient Rx  Name  Route  Sig  Dispense  Refill  . doxycycline (VIBRAMYCIN) 100 MG capsule   Oral   Take 1 capsule (100 mg total) by mouth 2 (two) times daily.   20 capsule   0   .  oxyCODONE (ROXICODONE) 5 MG immediate release tablet   Oral   Take 1-2 tablets (5-10 mg total) by mouth every 6 (six) hours as needed for pain.   20 tablet   0     There were no vitals taken for this visit.  Physical Exam  Nursing note and vitals reviewed. Constitutional: He is oriented to person, place, and time. He appears well-developed and well-nourished. No distress.  HENT:  Head: Normocephalic and atraumatic.  Eyes: Conjunctivae and EOM are normal.  Neck: Normal range of motion. Neck supple.  No meningeal signs  Cardiovascular: Normal rate, regular rhythm and normal heart sounds.  Exam reveals no gallop and no friction rub.   No murmur heard. Pulmonary/Chest: Effort normal and breath sounds normal. No respiratory distress. He has no wheezes. He has no rales. He exhibits no tenderness.  Abdominal: Soft. Bowel sounds are normal. He exhibits no distension. There is no tenderness. There is no rebound and no guarding.  Musculoskeletal: Normal range of motion. He exhibits edema and tenderness.  FROM to upper and lower extremities, decreased ROM limited to injured 5th digit of right hand secondary to pain. No ecchymosis. Edema noted. No nail involvement.   Neurological: He is alert and oriented to person, place, and time. No cranial nerve deficit.  Sensation normal to light touch and two point discrimination Moves extremities without ataxia, coordination intact Normal gait and balance Normal strength in upper and lower extremities bilaterally including dorsiflexion and plantar flexion, strong and equal grip strength   Skin: Skin is warm and dry. He is not diaphoretic. No erythema.  Psychiatric: He has a normal mood and affect.    ED Course  Procedures (including critical care time)  Labs Reviewed - No data to display Dg Finger Little Right  02/19/2013   *RADIOLOGY REPORT*  Clinical Data: Right little finger injury.  RIGHT LITTLE FINGER 2+V  Comparison: 08/07/2011  Findings: No  acute bony abnormality.  Specifically, no fracture, subluxation, or dislocation.  Soft tissues are intact.  IMPRESSION: No acute bony abnormality.   Original Report Authenticated By: Charlett Nose, M.D.     1. Injury of fifth finger, right, initial encounter       MDM  Crush injury to distal phalanx of fifth digit of right hand. Imaging shows no fracture. Directed pt to ice injury, take acetaminophen or ibuprofen for pain, and to elevate and rest the injury when possible. Splinted finger for support and comfort. Discussed reasons to seek immediate care. Patient expresses understanding and agrees with plan. Provided resource list and information on Affordable Care Act. Discussed pt care plan with Dr. Manus Gunning who is in agreement.        Glade Nurse, PA-C 02/19/13 1723

## 2013-03-01 ENCOUNTER — Encounter (HOSPITAL_BASED_OUTPATIENT_CLINIC_OR_DEPARTMENT_OTHER): Payer: Self-pay | Admitting: *Deleted

## 2013-03-01 ENCOUNTER — Emergency Department (HOSPITAL_BASED_OUTPATIENT_CLINIC_OR_DEPARTMENT_OTHER)
Admission: EM | Admit: 2013-03-01 | Discharge: 2013-03-01 | Disposition: A | Discharge status: Home / Self Care | Payer: Self-pay | Attending: Emergency Medicine | Admitting: Emergency Medicine

## 2013-03-01 DIAGNOSIS — Y929 Unspecified place or not applicable: Secondary | ICD-10-CM | POA: Insufficient documentation

## 2013-03-01 DIAGNOSIS — T63481A Toxic effect of venom of other arthropod, accidental (unintentional), initial encounter: Secondary | ICD-10-CM | POA: Insufficient documentation

## 2013-03-01 DIAGNOSIS — F17200 Nicotine dependence, unspecified, uncomplicated: Secondary | ICD-10-CM | POA: Insufficient documentation

## 2013-03-01 DIAGNOSIS — R21 Rash and other nonspecific skin eruption: Secondary | ICD-10-CM | POA: Insufficient documentation

## 2013-03-01 DIAGNOSIS — Z8661 Personal history of infections of the central nervous system: Secondary | ICD-10-CM | POA: Insufficient documentation

## 2013-03-01 DIAGNOSIS — S6990XA Unspecified injury of unspecified wrist, hand and finger(s), initial encounter: Secondary | ICD-10-CM | POA: Insufficient documentation

## 2013-03-01 DIAGNOSIS — Y9389 Activity, other specified: Secondary | ICD-10-CM | POA: Insufficient documentation

## 2013-03-01 DIAGNOSIS — L738 Other specified follicular disorders: Secondary | ICD-10-CM | POA: Insufficient documentation

## 2013-03-01 DIAGNOSIS — Z8669 Personal history of other diseases of the nervous system and sense organs: Secondary | ICD-10-CM | POA: Insufficient documentation

## 2013-03-01 MED ORDER — DIPHENHYDRAMINE HCL 25 MG PO CAPS
25.0000 mg | ORAL_CAPSULE | Freq: Once | ORAL | Status: AC
  Administered 2013-03-01: 25 mg via ORAL
  Filled 2013-03-01: qty 1

## 2013-03-01 NOTE — ED Notes (Signed)
Pt states he noticed his left forearm was red and swollen this a.m. Also c/o knots under arms and his right hand hurting. "Feel ill."

## 2013-03-01 NOTE — ED Provider Notes (Signed)
History     This chart was scribed for [name], MD by Jiles Prows, ED Scribe. The patient was seen in room MH11/MH11 and the patient's care was started at 4:53 PM.    CSN: [id number]  Arrival date & time 03/01/13  1613   Chief Complaint  Patient presents with  . Wound Infection    The history is provided by the patient and the spouse. No language interpreter was used.   HPI Comments: Blake Green is a 32 y.o. male who presents to the Emergency Department complaining of pain to left arm onset this morning.  He reports severe pain in right hand and states he has broken his hand 13 times. He claims pain in left forearm and underarm.  He reports that his family is currently moving so he may have come in contact with bees or mosquitoes. Seen by myself for mild folliculitis in L axilla about 2 weeks ago he states is not any better despite taking Abx Pt denies headache, diaphoresis, fever, chills, nausea, vomiting, diarrhea, weakness, cough, SOB and any other pain.    Past Medical History  Diagnosis Date  . HOH (hard of hearing)   . Neck pain   . Meningitis     Past Surgical History  Procedure Laterality Date  . Skin graft      History reviewed. No pertinent family history.  History  Substance Use Topics  . Smoking status: Current Every Day Smoker -- 0.50 packs/day    Types: Cigarettes  . Smokeless tobacco: Not on file  . Alcohol Use: Yes      Review of Systems  Constitutional: Negative for fever and chills.  HENT: Negative for sore throat and mouth sores.   Respiratory: Negative for cough and shortness of breath.   Gastrointestinal: Negative for nausea, vomiting and diarrhea.  Skin: Positive for rash.  Neurological: Negative for seizures and syncope.   A complete 10 system review of systems was obtained and all systems are negative except as noted in the HPI and PMH.   Allergies  Tylenol  Home Medications   Current Outpatient Rx  Name  Route  Sig   Dispense  Refill  . doxycycline (VIBRAMYCIN) 100 MG capsule   Oral   Take 1 capsule (100 mg total) by mouth 2 (two) times daily.   20 capsule   0   . oxyCODONE (ROXICODONE) 5 MG immediate release tablet   Oral   Take 1-2 tablets (5-10 mg total) by mouth every 6 (six) hours as needed for pain.   20 tablet   0     Pulse 84  Temp(Src) 98.1 F (36.7 C) (Oral)  Resp 18  Ht 6' (1.829 m)  Wt 200 lb (90.719 kg)  BMI 27.12 kg/m2  SpO2 98%  Physical Exam  Nursing note and vitals reviewed. Constitutional: He is oriented to person, place, and time. He appears well-developed and well-nourished.  HENT:  Head: Normocephalic and atraumatic.  Eyes: EOM are normal. Pupils are equal, round, and reactive to light.  Neck: Normal range of motion. Neck supple.  Cardiovascular: Normal rate, normal heart sounds and intact distal pulses.   Pulmonary/Chest: Effort normal and breath sounds normal.  Abdominal: Bowel sounds are normal. He exhibits no distension. There is no tenderness.  Musculoskeletal: Normal range of motion. He exhibits no edema and no tenderness.  Neurological: He is alert and oriented to person, place, and time. He has normal strength. No cranial nerve deficit or sensory deficit.  Skin: Skin is warm and dry. Rash noted.  Folliculitis in L axilla completely resolved; rash on L forearm appears to be localized allergic reaction to insect stings. No signs of infection at this time, however erythema margin marked for observation at home.   Psychiatric: He has a normal mood and affect.    ED Course  Procedures (including critical care time) DIAGNOSTIC STUDIES: Oxygen Saturation is 98% on RA, normal by my interpretation.    COORDINATION OF CARE: 4:55 PM - Discussed ED treatment with pt at bedside including benadryl and ice and pt agrees.     Labs Reviewed - No data to display No results found.   1. Insect sting allergy, current reaction, initial encounter       MDM  I  personally performed the services described in this documentation, which was scribed in my presence. The recorded information has been reviewed and is accurate.        [name], MD 03/01/13 [phone]

## 2013-03-18 ENCOUNTER — Encounter (HOSPITAL_BASED_OUTPATIENT_CLINIC_OR_DEPARTMENT_OTHER): Payer: Self-pay | Admitting: *Deleted

## 2013-03-18 ENCOUNTER — Emergency Department (HOSPITAL_BASED_OUTPATIENT_CLINIC_OR_DEPARTMENT_OTHER)
Admission: EM | Admit: 2013-03-18 | Discharge: 2013-03-18 | Disposition: A | Discharge status: Home / Self Care | Payer: Self-pay | Attending: Emergency Medicine | Admitting: Emergency Medicine

## 2013-03-18 DIAGNOSIS — F17200 Nicotine dependence, unspecified, uncomplicated: Secondary | ICD-10-CM | POA: Insufficient documentation

## 2013-03-18 DIAGNOSIS — R21 Rash and other nonspecific skin eruption: Secondary | ICD-10-CM | POA: Insufficient documentation

## 2013-03-18 DIAGNOSIS — A6920 Lyme disease, unspecified: Secondary | ICD-10-CM | POA: Insufficient documentation

## 2013-03-18 DIAGNOSIS — Z8661 Personal history of infections of the central nervous system: Secondary | ICD-10-CM | POA: Insufficient documentation

## 2013-03-18 MED ORDER — HYDROCODONE-IBUPROFEN 7.5-200 MG PO TABS: 1.0000 | ORAL_TABLET | Freq: Three times a day (TID) | ORAL | Status: DC | PRN

## 2013-03-18 MED ORDER — DOXYCYCLINE HYCLATE 100 MG PO CAPS: 100.0000 mg | ORAL_CAPSULE | Freq: Two times a day (BID) | ORAL | Status: DC

## 2013-03-18 NOTE — ED Provider Notes (Signed)
   History    CSN: [id number] Arrival date & time 03/18/13  1657  First MD Initiated Contact with Patient 03/18/13 1710     Chief Complaint  Patient presents with  . Abscess   (Consider location/radiation/quality/duration/timing/severity/associated sxs/prior Treatment) Patient is a 32 y.o. male presenting with abscess. The history is provided by the patient.  Abscess Location:  Torso Torso abscess location: mid abdomen. Abscess quality: itching, painful and redness   Progression:  Worsening Pain details:    Quality:  Aching   Timing:  Constant Chronicity:  New Relieved by:  Nothing Worsened by:  Nothing tried Ineffective treatments:  None tried Pt reports he thinks something bit him 2 days ago. Pt complains of a red swollen area on his abdomen.  Pt complains of feeling achy,  Chills, fever on and off Past Medical History  Diagnosis Date  . HOH (hard of hearing)   . Neck pain   . Meningitis    Past Surgical History  Procedure Laterality Date  . Skin graft     History reviewed. No pertinent family history. History  Substance Use Topics  . Smoking status: Current Every Day Smoker -- 0.50 packs/day    Types: Cigarettes  . Smokeless tobacco: Not on file  . Alcohol Use: Yes    Review of Systems  Skin: Positive for rash.  All other systems reviewed and are negative.    Allergies  Tylenol  Home Medications   Current Outpatient Rx  Name  Route  Sig  Dispense  Refill  . doxycycline (VIBRAMYCIN) 100 MG capsule   Oral   Take 1 capsule (100 mg total) by mouth 2 (two) times daily.   20 capsule   0   . oxyCODONE (ROXICODONE) 5 MG immediate release tablet   Oral   Take 1-2 tablets (5-10 mg total) by mouth every 6 (six) hours as needed for pain.   20 tablet   0    BP 127/84  Pulse 90  Temp(Src) 98.5 F (36.9 C) (Oral)  Resp 16  Ht 6' (1.829 m)  Wt 200 lb (90.719 kg)  BMI 27.12 kg/m2  SpO2 99% Physical Exam  Nursing note and vitals  reviewed. Constitutional: He is oriented to person, place, and time. He appears well-developed and well-nourished.  HENT:  Head: Normocephalic.  Cardiovascular: Normal rate.   Pulmonary/Chest: Effort normal.  Abdominal: Soft.  Musculoskeletal: Normal range of motion.  Neurological: He is alert and oriented to person, place, and time.  Skin: Rash noted. There is erythema.  Bull's eye  Rash abdomen,  Psychiatric: He has a normal mood and affect.    ED Course  Procedures (including critical care time) Labs Reviewed  B. BURGDORFI ANTIBODIES  ROCKY MTN SPOTTED FVR AB, IGG-BLOOD   No results found. No diagnosis found.  MDM  Dr. Manus Gunning in to see and examine.    RMSF and lyme titer ordered.    Pt given rx for doxycycline 28 one bid,  Hydrocodone,    Pt advised to return if symptoms worsen or cahnge  Elson Areas, PA-C 03/18/13 1820

## 2013-03-18 NOTE — ED Notes (Signed)
Pt c/o abscess to abd x 2 days

## 2013-03-18 NOTE — ED Notes (Signed)
MD at bedside. 

## 2013-03-18 NOTE — ED Provider Notes (Signed)
Medical screening examination/treatment/procedure(s) were conducted as a shared visit with non-physician practitioner(s) and myself.  I personally evaluated the patient during the encounter  Erythema migrans rash to abdomen. No fever, headache, dizziness, lightheadedness.   Glynn Octave, MD 03/18/13 [phone]

## 2013-03-19 LAB — B. BURGDORFI ANTIBODIES: B burgdorferi Ab IgG+IgM: 0.2 {ISR}

## 2013-04-03 ENCOUNTER — Emergency Department (HOSPITAL_BASED_OUTPATIENT_CLINIC_OR_DEPARTMENT_OTHER)
Admission: EM | Admit: 2013-04-03 | Discharge: 2013-04-03 | Disposition: A | Discharge status: Home / Self Care | Payer: Self-pay | Attending: Emergency Medicine | Admitting: Emergency Medicine

## 2013-04-03 ENCOUNTER — Emergency Department (HOSPITAL_BASED_OUTPATIENT_CLINIC_OR_DEPARTMENT_OTHER): Payer: Self-pay

## 2013-04-03 ENCOUNTER — Encounter (HOSPITAL_BASED_OUTPATIENT_CLINIC_OR_DEPARTMENT_OTHER): Payer: Self-pay | Admitting: *Deleted

## 2013-04-03 DIAGNOSIS — Z8669 Personal history of other diseases of the nervous system and sense organs: Secondary | ICD-10-CM | POA: Insufficient documentation

## 2013-04-03 DIAGNOSIS — S60221A Contusion of right hand, initial encounter: Secondary | ICD-10-CM

## 2013-04-03 DIAGNOSIS — Y9389 Activity, other specified: Secondary | ICD-10-CM | POA: Insufficient documentation

## 2013-04-03 DIAGNOSIS — IMO0002 Reserved for concepts with insufficient information to code with codable children: Secondary | ICD-10-CM | POA: Insufficient documentation

## 2013-04-03 DIAGNOSIS — H9190 Unspecified hearing loss, unspecified ear: Secondary | ICD-10-CM | POA: Insufficient documentation

## 2013-04-03 DIAGNOSIS — S61411A Laceration without foreign body of right hand, initial encounter: Secondary | ICD-10-CM

## 2013-04-03 DIAGNOSIS — F17200 Nicotine dependence, unspecified, uncomplicated: Secondary | ICD-10-CM | POA: Insufficient documentation

## 2013-04-03 DIAGNOSIS — Y929 Unspecified place or not applicable: Secondary | ICD-10-CM | POA: Insufficient documentation

## 2013-04-03 DIAGNOSIS — S60229A Contusion of unspecified hand, initial encounter: Secondary | ICD-10-CM | POA: Insufficient documentation

## 2013-04-03 DIAGNOSIS — S61409A Unspecified open wound of unspecified hand, initial encounter: Secondary | ICD-10-CM | POA: Insufficient documentation

## 2013-04-03 MED ORDER — IBUPROFEN 800 MG PO TABS: 800.0000 mg | ORAL_TABLET | Freq: Three times a day (TID) | ORAL | Status: DC

## 2013-04-03 NOTE — ED Provider Notes (Signed)
   History    CSN: [id number] Arrival date & time 04/03/13  1336  None    Chief Complaint  Patient presents with  . Laceration   (Consider location/radiation/quality/duration/timing/severity/associated sxs/prior Treatment) Patient is a 32 y.o. male presenting with skin laceration. The history is provided by the patient. No language interpreter was used.  Laceration Location:  Hand Hand laceration location:  R hand Length (cm):  1 Depth:  Cutaneous Pain details:    Quality:  Aching Foreign body present:  No foreign bodies Relieved by:  Nothing Worsened by:  Nothing tried Pt hit hand over a piece of furniture.   Pt complains of pain to area and a laceration Past Medical History  Diagnosis Date  . HOH (hard of hearing)   . Neck pain   . Meningitis    Past Surgical History  Procedure Laterality Date  . Skin graft     No family history on file. History  Substance Use Topics  . Smoking status: Current Every Day Smoker -- 0.50 packs/day    Types: Cigarettes  . Smokeless tobacco: Not on file  . Alcohol Use: Yes    Review of Systems  Skin: Positive for wound.  All other systems reviewed and are negative.    Allergies  Tylenol  Home Medications   Current Outpatient Rx  Name  Route  Sig  Dispense  Refill  . doxycycline (VIBRAMYCIN) 100 MG capsule   Oral   Take 1 capsule (100 mg total) by mouth 2 (two) times daily.   20 capsule   0   . doxycycline (VIBRAMYCIN) 100 MG capsule   Oral   Take 1 capsule (100 mg total) by mouth 2 (two) times daily.   28 capsule   0   . HYDROcodone-ibuprofen (VICOPROFEN) 7.5-200 MG per tablet   Oral   Take 1 tablet by mouth every 8 (eight) hours as needed for pain.   14 tablet   0   . oxyCODONE (ROXICODONE) 5 MG immediate release tablet   Oral   Take 1-2 tablets (5-10 mg total) by mouth every 6 (six) hours as needed for pain.   20 tablet   0    BP 153/84  Pulse 107  Temp(Src) 98.7 F (37.1 C) (Oral)  Resp 18  Wt 200  lb (90.719 kg)  BMI 27.12 kg/m2  SpO2 99% Physical Exam  Nursing note and vitals reviewed. Constitutional: He is oriented to person, place, and time. He appears well-developed and well-nourished.  HENT:  Head: Normocephalic and atraumatic.  Musculoskeletal: He exhibits tenderness.  Tender 3rd finger knuckle,  Superficial laceration,  From,  nv and ns intact  Neurological: He is alert and oriented to person, place, and time. He has normal reflexes.  Skin: Skin is warm.  Psychiatric: He has a normal mood and affect.    ED Course  Procedures (including critical care time) Labs Reviewed - No data to display No results found. 1. Contusion of right hand, initial encounter   2. Laceration of right hand, initial encounter     MDM  Pt recently seen by me after a tick bite,  No rash on abd now.  Pt feels better, slightly elevated RMSF. Negative lyme.   Pt reports he took all of antibiotics.    I do not think this needs any further evaluation.   Wound care, bulky dressing  Elson Areas, PA-C 04/03/13 1455

## 2013-04-03 NOTE — ED Notes (Signed)
Was moving furniture this am and hit his right hand and a hard surface. Laceration and swelling to his 3rd digit. Bleeding controlled.Marland Kitchen

## 2013-04-03 NOTE — ED Provider Notes (Signed)
Medical screening examination/treatment/procedure(s) were performed by non-physician practitioner and as supervising physician I was immediately available for consultation/collaboration.   Laurence Folz, MD 04/03/13 1556 

## 2013-08-09 ENCOUNTER — Emergency Department (HOSPITAL_BASED_OUTPATIENT_CLINIC_OR_DEPARTMENT_OTHER)
Admission: EM | Admit: 2013-08-09 | Discharge: 2013-08-09 | Disposition: A | Discharge status: Home / Self Care | Payer: Self-pay | Attending: Emergency Medicine | Admitting: Emergency Medicine

## 2013-08-09 ENCOUNTER — Emergency Department (HOSPITAL_BASED_OUTPATIENT_CLINIC_OR_DEPARTMENT_OTHER): Payer: Self-pay

## 2013-08-09 ENCOUNTER — Encounter (HOSPITAL_BASED_OUTPATIENT_CLINIC_OR_DEPARTMENT_OTHER): Payer: Self-pay | Admitting: Emergency Medicine

## 2013-08-09 DIAGNOSIS — R1032 Left lower quadrant pain: Secondary | ICD-10-CM | POA: Insufficient documentation

## 2013-08-09 DIAGNOSIS — R319 Hematuria, unspecified: Secondary | ICD-10-CM | POA: Insufficient documentation

## 2013-08-09 DIAGNOSIS — K921 Melena: Secondary | ICD-10-CM | POA: Insufficient documentation

## 2013-08-09 DIAGNOSIS — R1031 Right lower quadrant pain: Secondary | ICD-10-CM | POA: Insufficient documentation

## 2013-08-09 DIAGNOSIS — R109 Unspecified abdominal pain: Secondary | ICD-10-CM

## 2013-08-09 DIAGNOSIS — Z8669 Personal history of other diseases of the nervous system and sense organs: Secondary | ICD-10-CM | POA: Insufficient documentation

## 2013-08-09 DIAGNOSIS — F17200 Nicotine dependence, unspecified, uncomplicated: Secondary | ICD-10-CM | POA: Insufficient documentation

## 2013-08-09 DIAGNOSIS — R1013 Epigastric pain: Secondary | ICD-10-CM | POA: Insufficient documentation

## 2013-08-09 DIAGNOSIS — M549 Dorsalgia, unspecified: Secondary | ICD-10-CM | POA: Insufficient documentation

## 2013-08-09 DIAGNOSIS — R112 Nausea with vomiting, unspecified: Secondary | ICD-10-CM | POA: Insufficient documentation

## 2013-08-09 HISTORY — DX: Lyme disease, unspecified: A69.20

## 2013-08-09 HISTORY — DX: Impulse disorder, unspecified: F63.9

## 2013-08-09 HISTORY — DX: Post-traumatic stress disorder, unspecified: F43.10

## 2013-08-09 HISTORY — DX: Unspecified mood (affective) disorder: F39

## 2013-08-09 HISTORY — DX: Other psychoactive substance abuse, in remission: F19.11

## 2013-08-09 HISTORY — DX: Cocaine abuse, uncomplicated: F14.10

## 2013-08-09 LAB — COMPREHENSIVE METABOLIC PANEL
ALT: 142 U/L — ABNORMAL HIGH (ref 0–53)
Alkaline Phosphatase: 102 U/L (ref 39–117)
CO2: 26 mEq/L (ref 19–32)
GFR calc Af Amer: >90 mL/min (ref >90)
GFR calc non Af Amer: >90 mL/min (ref >90)
Glucose, Bld: 127 mg/dL — ABNORMAL HIGH (ref 70–99)
Potassium: 3.9 mEq/L (ref 3.5–5.1)
Sodium: 140 mEq/L (ref 135–145)
Total Bilirubin: 0.3 mg/dL (ref 0.3–1.2)

## 2013-08-09 LAB — URINE MICROSCOPIC-ADD ON

## 2013-08-09 LAB — URINALYSIS, ROUTINE W REFLEX MICROSCOPIC
Glucose, UA: NEGATIVE mg/dL
Leukocytes, UA: NEGATIVE
Protein, ur: NEGATIVE mg/dL
pH: 7.5 (ref 5.0–8.0)

## 2013-08-09 LAB — CBC WITH DIFFERENTIAL/PLATELET
Eosinophils Absolute: 0.1 10*3/uL (ref 0.0–0.7)
Hemoglobin: 13.3 g/dL (ref 13.0–17.0)
Lymphs Abs: 1.6 10*3/uL (ref 0.7–4.0)
MCH: 27.7 pg (ref 26.0–34.0)
Monocytes Relative: 9 % (ref 3–12)
Neutro Abs: 3.9 10*3/uL (ref 1.7–7.7)
Neutrophils Relative %: 63 % (ref 43–77)
RBC: 4.81 MIL/uL (ref 4.22–5.81)

## 2013-08-09 LAB — OCCULT BLOOD X 1 CARD TO LAB, STOOL: Fecal Occult Bld: NEGATIVE

## 2013-08-09 MED ORDER — HYDROMORPHONE HCL PF 1 MG/ML IJ SOLN
0.5000 mg | Freq: Once | INTRAMUSCULAR | Status: AC
  Administered 2013-08-09: 0.5 mg via INTRAVENOUS
  Filled 2013-08-09: qty 1

## 2013-08-09 MED ORDER — PANTOPRAZOLE SODIUM 40 MG IV SOLR
40.0000 mg | Freq: Once | INTRAVENOUS | Status: AC
Start: 2013-08-09 — End: 2013-08-09
  Administered 2013-08-09: 40 mg via INTRAVENOUS
  Filled 2013-08-09: qty 40

## 2013-08-09 MED ORDER — ONDANSETRON HCL 4 MG/2ML IJ SOLN
4.0000 mg | Freq: Once | INTRAMUSCULAR | Status: AC
  Administered 2013-08-09: 4 mg via INTRAVENOUS
  Filled 2013-08-09: qty 2

## 2013-08-09 MED ORDER — HYDROMORPHONE HCL PF 1 MG/ML IJ SOLN
0.5000 mg | Freq: Once | INTRAMUSCULAR | Status: AC
  Administered 2013-08-09: 0.5 mg via INTRAVENOUS

## 2013-08-09 MED ORDER — SODIUM CHLORIDE 0.9 % IV BOLUS (SEPSIS)
1000.0000 mL | Freq: Once | INTRAVENOUS | Status: AC
  Administered 2013-08-09: 1000 mL via INTRAVENOUS

## 2013-08-09 NOTE — ED Notes (Signed)
Checked on Occult blood card results delay.  An order correction was required in the computer system and the lab personnel will post the results.  Patient has been ambulatory at bedside for urine specimen collection.

## 2013-08-09 NOTE — ED Provider Notes (Signed)
CSN: [id number]     Arrival date & time 08/09/13  1453 History   First MD Initiated Contact with Patient 08/09/13 1505     Chief Complaint  Patient presents with  . Abdominal Pain   (Consider location/radiation/quality/duration/timing/severity/associated sxs/prior Treatment) HPI Comments: A 32 year old male presents to ED complaining of diffuse abdominal pain X 3 weeks that has been getting worse. This started suddenly with diffuse abd pain and vomiting after he eats anything. Laying on his side makes his pain better and laying on his back and bending makes it worse. No hematemesis.  He started taking pepto bismol 5 days ago that helped to relieve some vomiting. 3 days ago, he noticed black stool that smells like "rotten egg", lower back pain, hematuria and burning with urination. BM twice a day and no blood during wipe. No urination today. He went to urgent care yesterday and given vicoprofen and abx that he could not recall name, Levaquin and Zofran. He reports he came here because it was no better. He also reports having a negative contrasted CT scan done yesterday that was negative. He denies fever, chills, CP, SOB,  HA or dizziness. He does smokes a 1/2 a pack a day. No similar hx in past.    Patient is a 32 y.o. male presenting with abdominal pain. The history is provided by the patient. A language interpreter was used.  Abdominal Pain Associated symptoms: diarrhea, dysuria, hematuria, nausea and vomiting   Associated symptoms: no chest pain, no chills, no constipation, no cough, no fever, no shortness of breath and no sore throat     Past Medical History  Diagnosis Date  . HOH (hard of hearing)   . Neck pain   . Meningitis    Past Surgical History  Procedure Laterality Date  . Skin graft     History reviewed. No pertinent family history. History  Substance Use Topics  . Smoking status: Current Every Day Smoker -- 0.50 packs/day    Types: Cigarettes  . Smokeless tobacco: Not on  file  . Alcohol Use: No    Review of Systems  Constitutional: Negative for fever and chills.  HENT: Negative for sore throat and trouble swallowing.   Respiratory: Negative for cough, chest tightness and shortness of breath.   Cardiovascular: Negative for chest pain.  Gastrointestinal: Positive for nausea, vomiting, abdominal pain, diarrhea and blood in stool. Negative for constipation.       Black tarry stool.  Genitourinary: Positive for dysuria, hematuria, decreased urine volume and difficulty urinating. Negative for testicular pain.  Musculoskeletal: Positive for back pain. Negative for arthralgias and myalgias.  Skin: Negative for pallor and rash.  Neurological: Negative for dizziness and headaches.    Allergies  Tylenol  Home Medications   Current Outpatient Rx  Name  Route  Sig  Dispense  Refill  . hydrocodone-ibuprofen (VICOPROFEN) 5-200 MG per tablet   Oral   Take 1 tablet by mouth every 6 (six) hours as needed for pain.          BP 132/83  Pulse 83  Temp(Src) 97.6 F (36.4 C) (Oral)  Resp 20  Ht 6\' 1"  (1.854 m)  Wt 195 lb (88.451 kg)  BMI 25.73 kg/m2  SpO2 100% Physical Exam  Constitutional: He is oriented to person, place, and time. He appears well-developed and well-nourished. No distress.  HENT:  Head: Normocephalic and atraumatic.  Neck: Normal range of motion. Neck supple.  Cardiovascular: Normal rate and regular rhythm.  Pulmonary/Chest: Effort normal and breath sounds normal. No respiratory distress. He has no wheezes.  Abdominal: Soft. Bowel sounds are normal. There is tenderness. There is no rebound and no guarding.  TTP at epigastric, RLQ and LLQ. No rebound tenderness. Negative murphy sign.   Genitourinary: Rectum normal and prostate normal.  Musculoskeletal: Normal range of motion.  Neurological: He is alert and oriented to person, place, and time.  Skin: Skin is warm and dry. No rash noted. No erythema. No pallor.  Psychiatric: He has a  normal mood and affect.    ED Course  Procedures (including critical care time) Labs Review Labs Reviewed  CBC WITH DIFFERENTIAL  LIPASE, BLOOD  COMPREHENSIVE METABOLIC PANEL  URINALYSIS, ROUTINE W REFLEX MICROSCOPIC   Results for orders placed during the hospital encounter of 08/09/13  CBC WITH DIFFERENTIAL      Result Value Range   WBC 6.2  4.0 - 10.5 K/uL   RBC 4.81  4.22 - 5.81 MIL/uL   Hemoglobin 13.3  13.0 - 17.0 g/dL   HCT 16.1  09.6 - 04.5 %   MCV 85.4  78.0 - 100.0 fL   MCH 27.7  26.0 - 34.0 pg   MCHC 32.4  30.0 - 36.0 g/dL   RDW 40.9  81.1 - 91.4 %   Platelets 245  150 - 400 K/uL   Neutrophils Relative % 63  43 - 77 %   Neutro Abs 3.9  1.7 - 7.7 K/uL   Lymphocytes Relative 25  12 - 46 %   Lymphs Abs 1.6  0.7 - 4.0 K/uL   Monocytes Relative 9  3 - 12 %   Monocytes Absolute 0.6  0.1 - 1.0 K/uL   Eosinophils Relative 2  0 - 5 %   Eosinophils Absolute 0.1  0.0 - 0.7 K/uL   Basophils Relative 0  0 - 1 %   Basophils Absolute 0.0  0.0 - 0.1 K/uL  LIPASE, BLOOD      Result Value Range   Lipase 16  11 - 59 U/L  COMPREHENSIVE METABOLIC PANEL      Result Value Range   Sodium 140  135 - 145 mEq/L   Potassium 3.9  3.5 - 5.1 mEq/L   Chloride 104  96 - 112 mEq/L   CO2 26  19 - 32 mEq/L   Glucose, Bld 127 (*) 70 - 99 mg/dL   BUN 12  6 - 23 mg/dL   Creatinine, Ser 7.82  0.50 - 1.35 mg/dL   Calcium 9.4  8.4 - 95.6 mg/dL   Total Protein 6.9  6.0 - 8.3 g/dL   Albumin 3.8  3.5 - 5.2 g/dL   AST 52 (*) 0 - 37 U/L   ALT 142 (*) 0 - 53 U/L   Alkaline Phosphatase 102  39 - 117 U/L   Total Bilirubin 0.3  0.3 - 1.2 mg/dL   GFR calc non Af Amer >90  >90 mL/min   GFR calc Af Amer >90  >90 mL/min  URINALYSIS, ROUTINE W REFLEX MICROSCOPIC      Result Value Range   Color, Urine YELLOW  YELLOW   APPearance CLOUDY (*) CLEAR   Specific Gravity, Urine 1.020  1.005 - 1.030   pH 7.5  5.0 - 8.0   Glucose, UA NEGATIVE  NEGATIVE mg/dL   Hgb urine dipstick LARGE (*) NEGATIVE   Bilirubin  Urine NEGATIVE  NEGATIVE   Ketones, ur NEGATIVE  NEGATIVE mg/dL   Protein, ur NEGATIVE  NEGATIVE  mg/dL   Urobilinogen, UA 1.0  0.0 - 1.0 mg/dL   Nitrite NEGATIVE  NEGATIVE   Leukocytes, UA NEGATIVE  NEGATIVE  OCCULT BLOOD X 1 CARD TO LAB, STOOL      Result Value Range   Fecal Occult Bld NEGATIVE  NEGATIVE  URINE MICROSCOPIC-ADD ON      Result Value Range   RBC / HPF 21-50  <3 RBC/hpf   Bacteria, UA MANY (*) RARE   Urine-Other MUCOUS PRESENT      Imaging Review No results found.  EKG Interpretation   None       MDM  No diagnosis found. 1. Abdominal pain 2. Hematuria  The patient reports pain is mildly improved with IV pain medications. When discussing his lab results, he asked if pain medications that were prescribed yesterday could be changed from Vicoprofen to oxycodone. Discussed that it was not indicated.  The patient became upset and discontinued the IV without waiting for nursing. He leaves before discharge could be done and referral made to urology.    Arnoldo Hooker, PA-C 08/09/13 1909

## 2013-08-09 NOTE — ED Notes (Signed)
Found patient standing in the hallway, dressed, stating he was going outside to make a phone call.  He has removed his IV, no dressing applied.  He stated he was 'not satisfied' with the medications he was being sent home with, that 'anti-inflammatories don't work and I won't get it filled.'  He is presently wanting to leave.  Pt encouraged to sit on the bed, allow [place] to take him outside in a wheelchair.  Patient is agreeable and S. Upstill, PA notified of what patient has done/said.

## 2013-08-09 NOTE — ED Notes (Signed)
Pt states he is having generalized abdominal pain for three days.  Some N/V.  No known fever.  Some dark/black stools.

## 2013-08-09 NOTE — ED Provider Notes (Signed)
Medical screening examination/treatment/procedure(s) were performed by non-physician practitioner and as supervising physician I was immediately available for consultation/collaboration.  EKG Interpretation   None         Uyen Eichholz B. Bernette Mayers, MD 08/09/13 1910

## 2013-08-09 NOTE — ED Notes (Signed)
I informed patient that we would need to perform a urinary catherization if he is unable to void.  He requests additional pain medication to 'help me urinate.'

## 2013-08-09 NOTE — ED Notes (Signed)
Melvenia Beam, PA updated that patient recently had a CT abdomen and that he is requesting we not perform another at this time.

## 2013-08-09 NOTE — ED Notes (Signed)
Provider(student) at bedside

## 2013-08-09 NOTE — ED Notes (Signed)
Spoke with Elpidio Anis, PA who verbalized it was ok to administer the entire Dilaudid 1 mg IV instead of 0.5mg  d/t pain level of 9/10.  Patient also aware of need for urine specimen, states unable to go at this time.

## 2014-04-26 ENCOUNTER — Encounter (HOSPITAL_BASED_OUTPATIENT_CLINIC_OR_DEPARTMENT_OTHER): Payer: Self-pay | Admitting: Emergency Medicine

## 2014-04-26 ENCOUNTER — Emergency Department (HOSPITAL_BASED_OUTPATIENT_CLINIC_OR_DEPARTMENT_OTHER)
Admission: EM | Admit: 2014-04-26 | Discharge: 2014-04-26 | Disposition: A | Discharge status: Home / Self Care | Payer: Self-pay | Attending: Emergency Medicine | Admitting: Emergency Medicine

## 2014-04-26 DIAGNOSIS — L02219 Cutaneous abscess of trunk, unspecified: Secondary | ICD-10-CM | POA: Insufficient documentation

## 2014-04-26 DIAGNOSIS — L03119 Cellulitis of unspecified part of limb: Secondary | ICD-10-CM

## 2014-04-26 DIAGNOSIS — L03319 Cellulitis of trunk, unspecified: Secondary | ICD-10-CM

## 2014-04-26 DIAGNOSIS — L02419 Cutaneous abscess of limb, unspecified: Secondary | ICD-10-CM | POA: Insufficient documentation

## 2014-04-26 DIAGNOSIS — L02416 Cutaneous abscess of left lower limb: Secondary | ICD-10-CM

## 2014-04-26 DIAGNOSIS — Z8669 Personal history of other diseases of the nervous system and sense organs: Secondary | ICD-10-CM | POA: Insufficient documentation

## 2014-04-26 DIAGNOSIS — L0501 Pilonidal cyst with abscess: Secondary | ICD-10-CM | POA: Insufficient documentation

## 2014-04-26 DIAGNOSIS — F17200 Nicotine dependence, unspecified, uncomplicated: Secondary | ICD-10-CM | POA: Insufficient documentation

## 2014-04-26 DIAGNOSIS — Z8619 Personal history of other infectious and parasitic diseases: Secondary | ICD-10-CM | POA: Insufficient documentation

## 2014-04-26 DIAGNOSIS — L02213 Cutaneous abscess of chest wall: Secondary | ICD-10-CM

## 2014-04-26 MED ORDER — OXYCODONE HCL 5 MG PO TABS
5.0000 mg | ORAL_TABLET | Freq: Once | ORAL | Status: AC
  Administered 2014-04-26: 5 mg via ORAL
  Filled 2014-04-26: qty 1

## 2014-04-26 MED ORDER — SULFAMETHOXAZOLE-TRIMETHOPRIM 800-160 MG PO TABS: 1.0000 | ORAL_TABLET | Freq: Two times a day (BID) | ORAL | Status: AC

## 2014-04-26 MED ORDER — NAPROXEN 500 MG PO TABS: 500.0000 mg | ORAL_TABLET | Freq: Two times a day (BID) | ORAL | Status: DC

## 2014-04-26 MED ORDER — OXYCODONE HCL 5 MG PO TABS: 5.0000 mg | ORAL_TABLET | ORAL | Status: DC | PRN

## 2014-04-26 NOTE — ED Provider Notes (Signed)
CSN: [id number]     Arrival date & time 04/26/14  1156 History   First MD Initiated Contact with Patient 04/26/14 1230     Chief Complaint  Patient presents with  . Abscess     (Consider location/radiation/quality/duration/timing/severity/associated sxs/prior Treatment) Patient is a 33 y.o. male presenting with abscess. The history is provided by the patient.  Abscess Location:  Torso and ano-genital Torso abscess location:  R chest Ano-genital abscess location:  Groin  Blake Green is a 33 y.o. male who presents to the ED with abscesses. He has had a problem with recurrent abscesses at the end of his spine and was told that he would need to follow up with a surgeon to have the area cleaned out. This time he has the abscess is not only at the spine but there is one on the right chest wall and the left groin.   Past Medical History  Diagnosis Date  . HOH (hard of hearing)   . Neck pain   . Meningitis   . H/O: substance abuse   . Cocaine abuse   . Impulse control disorder, unspecified   . Episodic mood disorder   . Post traumatic stress disorder (PTSD)   . Lyme disease    Past Surgical History  Procedure Laterality Date  . Skin graft     No family history on file. History  Substance Use Topics  . Smoking status: Current Every Day Smoker -- 0.50 packs/day    Types: Cigarettes  . Smokeless tobacco: Not on file  . Alcohol Use: No    Review of Systems Negative except as stated in HPI   Allergies  Tylenol  Home Medications   Prior to Admission medications   Not on File   BP 134/84  Pulse 65  Temp(Src) 98 F (36.7 C) (Oral)  Resp 18  Wt 217 lb (98.431 kg)  SpO2 99% Physical Exam  Nursing note and vitals reviewed. Constitutional: He is oriented to person, place, and time. He appears well-developed and well-nourished. No distress.  HENT:  Head: Normocephalic.  Eyes: EOM are normal.  Neck: Neck supple.  Cardiovascular: Normal rate.   Pulmonary/Chest:  Effort normal.  Musculoskeletal: Normal range of motion.  Neurological: He is alert and oriented to person, place, and time. No cranial nerve deficit.  Skin:  Red raised area approximately 2 cm to the right chest wall. There are a few small red raised areas to the inner aspect of the left thigh and there is tenderness, swelling and warmth to the coccyx area that extends to the right buttock.   Psychiatric: He has a normal mood and affect. His behavior is normal.    ED Course: Dr. Gwendolyn GrantWalden in to examine the patient. He discussed with the patient that we will refer him to a general surgeon for I&D of the pilonidal cyst.   Procedures  INCISION AND DRAINAGE Performed by: Audryna Wendt Consent: Verbal consent obtained. Risks and benefits: risks, benefits and alternatives were discussed Type: abscess  Body area: right chest and left thigh  Anesthesia: local infiltration  Incision was made with a scalpel.  Local anesthetic: lidocaine 2% without epinephrine  Anesthetic total: 4 ml  Complexity: complex Blunt dissection to break up loculations  Drainage: purulent/bloody  Drainage amount: small  Packing material: none Patient tolerance: Patient tolerated the procedure well with no immediate complications.    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  Will start antibiotics and  pain medication and have patient continue warm wet soaks. He will call the general surgeon in the morning for follow up. He will return here as needed. Discussed with the patient and all questioned fully answered.    Medication List         naproxen 500 MG tablet  Commonly known as:  NAPROSYN  Take 1 tablet (500 mg total) by mouth 2 (two) times daily.     oxyCODONE 5 MG immediate release tablet  Commonly known as:  ROXICODONE  Take 1 tablet (5 mg total) by mouth every 4 (four) hours as needed for severe pain.     sulfamethoxazole-trimethoprim 800-160  MG per tablet  Commonly known as:  BACTRIM DS,SEPTRA DS  Take 1 tablet by mouth 2 (two) times daily.         [place], [place] 04/26/14 [id number]

## 2014-04-26 NOTE — Discharge Instructions (Signed)
We have opened the 2 smaller abscesses but will refer you to the general surgeon for the drainage of the pilonidal abscess. Take the antibiotics and pain medication as directed and call the surgeon tomorrow. Do not drive while taking the narcotic as it will make you sleepy.

## 2014-04-26 NOTE — ED Notes (Signed)
Patient here with abscess to right chest, left groin and sacrum. Reports history of same with several I & D's. Patient reports increased pain with no drainage x 1 week

## 2014-04-26 NOTE — ED Notes (Addendum)
NP at bedside.

## 2014-04-27 ENCOUNTER — Ambulatory Visit (INDEPENDENT_AMBULATORY_CARE_PROVIDER_SITE_OTHER): Payer: Self-pay | Admitting: General Surgery

## 2014-04-27 ENCOUNTER — Encounter (INDEPENDENT_AMBULATORY_CARE_PROVIDER_SITE_OTHER): Payer: Self-pay | Admitting: General Surgery

## 2014-04-27 VITALS — BP 130/80 | HR 87 | Temp 98.2°F | Ht 73.0 in | Wt 216.4 lb

## 2014-04-27 DIAGNOSIS — L0591 Pilonidal cyst without abscess: Secondary | ICD-10-CM

## 2014-04-27 MED ORDER — OXYCODONE HCL 5 MG PO TABS: 5.0000 mg | ORAL_TABLET | Freq: Three times a day (TID) | ORAL | Status: AC | PRN

## 2014-04-27 MED ORDER — OXYCODONE-ACETAMINOPHEN 5-325 MG PO TABS: 1.0000 | ORAL_TABLET | Freq: Four times a day (QID) | ORAL | Status: AC | PRN

## 2014-04-27 NOTE — Addendum Note (Signed)
Addended by: Axel FillerAMIREZ, Pretty Weltman on: 04/27/2014 04:21 PM   Modules accepted: Orders

## 2014-04-27 NOTE — Progress Notes (Signed)
Subjective:     Patient ID: Blake Green, male   DOB: May 24, 1981, 33 y.o.   MRN: [id number]  HPI The patient is a 33 year old male who comes in today secondary to multiple abscesses to the left groin area, right chest area, and pilonidal cyst. The patient states he was placed on antibiotics. He also had the left groin area and right chest area drained.  The patient states that he has pain upon sitting. He states he's not had this before.  Review of Systems  Constitutional: Negative.   HENT: Negative.   Eyes: Negative.   Respiratory: Negative.   Cardiovascular: Negative.   Gastrointestinal: Negative.   Endocrine: Negative.   Neurological: Negative.        Objective:   Physical Exam  Constitutional: He is oriented to person, place, and time. He appears well-developed and well-nourished.  HENT:  Head: Normocephalic and atraumatic.  Eyes: Conjunctivae and EOM are normal. Pupils are equal, round, and reactive to light.  Neck: Normal range of motion. Neck supple.  Cardiovascular: Normal rate, regular rhythm and normal heart sounds.   Pulmonary/Chest: Effort normal and breath sounds normal.  Musculoskeletal: Normal range of motion.  Neurological: He is alert and oriented to person, place, and time.  Skin: Skin is warm and dry.          Assessment:     33 year old male with a pilonidal cyst     Plan:     1 perirectal and continue with the Bactrim they have prescribed. 2. We'll have the patient back in 2 weeks for follow up of the pilonidal cyst and set him up for excision.

## 2014-04-27 NOTE — Addendum Note (Signed)
Addended by: Axel FillerAMIREZ, Damonie Furney on: 04/27/2014 04:23 PM   Modules accepted: Orders

## 2014-04-28 NOTE — ED Provider Notes (Addendum)
Medical screening examination/treatment/procedure(s) were conducted as a shared visit with non-physician practitioner(s) and myself.  I personally evaluated the patient during the encounter.   EKG Interpretation None      Patient here with multiple abscesses, small, no surrounding cellulitis. Patient with pilonidal, small, but veyr tender. I&D of small abscesses done, instructed to f/u with Gen Surg for the pilonidal.  Elwin MochaBlair Aariana Shankland, MD 04/28/14 1116  Elwin MochaBlair Nikolay Demetriou, MD 04/28/14 1116

## 2014-05-13 ENCOUNTER — Encounter (INDEPENDENT_AMBULATORY_CARE_PROVIDER_SITE_OTHER): Payer: Self-pay | Admitting: General Surgery

## 2014-06-11 ENCOUNTER — Encounter (INDEPENDENT_AMBULATORY_CARE_PROVIDER_SITE_OTHER): Payer: Self-pay | Admitting: General Surgery

## 2014-08-17 ENCOUNTER — Emergency Department (HOSPITAL_BASED_OUTPATIENT_CLINIC_OR_DEPARTMENT_OTHER)
Admission: EM | Admit: 2014-08-17 | Discharge: 2014-08-17 | Discharge status: Against Medical Advice | Payer: Self-pay | Attending: Emergency Medicine | Admitting: Emergency Medicine

## 2014-08-17 ENCOUNTER — Encounter (HOSPITAL_BASED_OUTPATIENT_CLINIC_OR_DEPARTMENT_OTHER): Payer: Self-pay | Admitting: *Deleted

## 2014-08-17 DIAGNOSIS — Y929 Unspecified place or not applicable: Secondary | ICD-10-CM | POA: Insufficient documentation

## 2014-08-17 DIAGNOSIS — S61431A Puncture wound without foreign body of right hand, initial encounter: Secondary | ICD-10-CM | POA: Insufficient documentation

## 2014-08-17 DIAGNOSIS — Y998 Other external cause status: Secondary | ICD-10-CM | POA: Insufficient documentation

## 2014-08-17 DIAGNOSIS — W540XXA Bitten by dog, initial encounter: Secondary | ICD-10-CM | POA: Insufficient documentation

## 2014-08-17 DIAGNOSIS — Y939 Activity, unspecified: Secondary | ICD-10-CM | POA: Insufficient documentation

## 2014-08-17 DIAGNOSIS — S61432A Puncture wound without foreign body of left hand, initial encounter: Secondary | ICD-10-CM | POA: Insufficient documentation

## 2014-08-17 NOTE — ED Notes (Signed)
Pt c/o dog bite to bil hands puncture wounds x 2 hrs ago

## 2016-02-20 ENCOUNTER — Emergency Department (HOSPITAL_BASED_OUTPATIENT_CLINIC_OR_DEPARTMENT_OTHER): Payer: Self-pay

## 2016-02-20 ENCOUNTER — Encounter (HOSPITAL_BASED_OUTPATIENT_CLINIC_OR_DEPARTMENT_OTHER): Payer: Self-pay | Admitting: *Deleted

## 2016-02-20 ENCOUNTER — Emergency Department (HOSPITAL_BASED_OUTPATIENT_CLINIC_OR_DEPARTMENT_OTHER)
Admission: EM | Admit: 2016-02-20 | Discharge: 2016-02-20 | Disposition: A | Discharge status: Home / Self Care | Payer: Self-pay | Attending: Emergency Medicine | Admitting: Emergency Medicine

## 2016-02-20 DIAGNOSIS — L0291 Cutaneous abscess, unspecified: Secondary | ICD-10-CM

## 2016-02-20 DIAGNOSIS — R110 Nausea: Secondary | ICD-10-CM | POA: Insufficient documentation

## 2016-02-20 DIAGNOSIS — F17210 Nicotine dependence, cigarettes, uncomplicated: Secondary | ICD-10-CM | POA: Insufficient documentation

## 2016-02-20 DIAGNOSIS — K611 Rectal abscess: Secondary | ICD-10-CM | POA: Insufficient documentation

## 2016-02-20 LAB — BASIC METABOLIC PANEL
Anion gap: 7 (ref 5–15)
BUN: 8 mg/dL (ref 6–20)
CO2: 24 mmol/L (ref 22–32)
Calcium: 9.2 mg/dL (ref 8.9–10.3)
Chloride: 106 mmol/L (ref 101–111)
Creatinine, Ser: 0.77 mg/dL (ref 0.61–1.24)
GFR calc Af Amer: >60 mL/min (ref >60)
GFR calc non Af Amer: >60 mL/min (ref >60)
Glucose, Bld: 96 mg/dL (ref 65–99)
Potassium: 3.9 mmol/L (ref 3.5–5.1)
Sodium: 137 mmol/L (ref 135–145)

## 2016-02-20 LAB — CBC WITH DIFFERENTIAL/PLATELET
Basophils Absolute: 0 10*3/uL (ref 0.0–0.1)
Basophils Relative: 0 %
Eosinophils Absolute: 0.2 10*3/uL (ref 0.0–0.7)
Eosinophils Relative: 4 %
HCT: 40 % (ref 39.0–52.0)
Hemoglobin: 13.3 g/dL (ref 13.0–17.0)
Lymphocytes Relative: 45 %
Lymphs Abs: 2.2 10*3/uL (ref 0.7–4.0)
MCH: 27 pg (ref 26.0–34.0)
MCHC: 33.3 g/dL (ref 30.0–36.0)
MCV: 81.1 fL (ref 78.0–100.0)
Monocytes Absolute: 0.4 10*3/uL (ref 0.1–1.0)
Monocytes Relative: 8 %
Neutro Abs: 2.1 10*3/uL (ref 1.7–7.7)
Neutrophils Relative %: 43 %
Platelets: 281 10*3/uL (ref 150–400)
RBC: 4.93 MIL/uL (ref 4.22–5.81)
RDW: 13.3 % (ref 11.5–15.5)
WBC: 5 10*3/uL (ref 4.0–10.5)

## 2016-02-20 MED ORDER — LEVOFLOXACIN 750 MG PO TABS: 750.0000 mg | ORAL_TABLET | Freq: Every day | ORAL | Status: DC

## 2016-02-20 MED ORDER — ONDANSETRON HCL 4 MG/2ML IJ SOLN
4.0000 mg | Freq: Once | INTRAMUSCULAR | Status: AC
  Administered 2016-02-20: 4 mg via INTRAVENOUS
  Filled 2016-02-20: qty 2

## 2016-02-20 MED ORDER — SODIUM CHLORIDE 0.9 % IV BOLUS (SEPSIS)
1000.0000 mL | Freq: Once | INTRAVENOUS | Status: AC
  Administered 2016-02-20: 1000 mL via INTRAVENOUS

## 2016-02-20 MED ORDER — METRONIDAZOLE 500 MG PO TABS: 500.0000 mg | ORAL_TABLET | Freq: Three times a day (TID) | ORAL | Status: DC

## 2016-02-20 MED ORDER — MORPHINE SULFATE (PF) 4 MG/ML IV SOLN
4.0000 mg | Freq: Once | INTRAVENOUS | Status: AC
  Administered 2016-02-20: 4 mg via INTRAVENOUS
  Filled 2016-02-20: qty 1

## 2016-02-20 MED ORDER — IOPAMIDOL (ISOVUE-300) INJECTION 61%
100.0000 mL | Freq: Once | INTRAVENOUS | Status: AC | PRN
  Administered 2016-02-20: 100 mL via INTRAVENOUS

## 2016-02-20 MED ORDER — NAPROXEN 500 MG PO TABS: 500.0000 mg | ORAL_TABLET | Freq: Two times a day (BID) | ORAL | Status: DC

## 2016-02-20 NOTE — ED Provider Notes (Signed)
CSN: [id number]     Arrival date & time 02/20/16  1324 History   First MD Initiated Contact with Patient 02/20/16 1358     Chief Complaint  Patient presents with  . Abscess    Blake Green is a 35 y.o. male who presents to the ED Complaining of an abscess near his rectum for the past 2-3 days. Patient reports he poked a hole and part of it last night and had some green drainage. He reports moderate to severe pain around his bottom and to his rectum. He reports pain when having a bowel movement. He denies any fevers. He reports he's had multiple abscesses previously, but never exactly in this spot. He has seen a general surgeon previously but has not been able to follow-up. He does not have insurance. The patient denies fevers, vomiting, diarrhea, rashes, abdominal pain, pain to his penis or his testicles, or rashes.   Patient is a 35 y.o. male presenting with abscess. The history is provided by the patient. No language interpreter was used.  Abscess Associated symptoms: nausea   Associated symptoms: no fever, no headaches and no vomiting     Past Medical History  Diagnosis Date  . HOH (hard of hearing)   . Neck pain   . Meningitis   . H/O: substance abuse   . Cocaine abuse   . Impulse control disorder, unspecified   . Episodic mood disorder (HCC)   . Post traumatic stress disorder (PTSD)   . Lyme disease    Past Surgical History  Procedure Laterality Date  . Skin graft     History reviewed. No pertinent family history. Social History  Substance Use Topics  . Smoking status: Current Every Day Smoker -- 0.50 packs/day    Types: Cigarettes  . Smokeless tobacco: None  . Alcohol Use: No    Review of Systems  Constitutional: Negative for fever and chills.  HENT: Negative for sore throat.   Eyes: Negative for visual disturbance.  Respiratory: Negative for cough and shortness of breath.   Cardiovascular: Negative for chest pain.  Gastrointestinal: Positive for nausea and  rectal pain. Negative for vomiting, abdominal pain, diarrhea and blood in stool.  Genitourinary: Negative for dysuria, penile swelling, scrotal swelling, difficulty urinating, penile pain and testicular pain.  Musculoskeletal: Negative for back pain and neck pain.  Skin: Negative for rash.  Neurological: Negative for headaches.      Allergies  Tylenol  Home Medications   Prior to Admission medications   Not on File   BP 125/89 mmHg  Pulse 60  Temp(Src) 97.5 F (36.4 C) (Oral)  Resp 18  Ht 6\' 2"  (1.88 m)  Wt 90.719 kg  BMI 25.67 kg/m2  SpO2 98% Physical Exam  Constitutional: He appears well-developed and well-nourished. No distress.  Nontoxic appearing.  HENT:  Head: Normocephalic and atraumatic.  Eyes: Right eye exhibits no discharge. Left eye exhibits no discharge.  Cardiovascular: Normal rate, regular rhythm and intact distal pulses.   Pulmonary/Chest: Effort normal. No respiratory distress.  Abdominal: Soft. There is no tenderness.  Genitourinary:  Fluctuant mass just above his rectum. On digital rectal exam the patient has fluctuance and tenderness at the 12:00 position. No drainage noted.  Neurological: He is alert. Coordination normal.  Skin: Skin is warm and dry. No rash noted. He is not diaphoretic. No pallor.  Psychiatric: He has a normal mood and affect. His behavior is normal.  Nursing note and vitals reviewed.   ED Course  Procedures (  including critical care time) Labs Review Labs Reviewed  BASIC METABOLIC PANEL  CBC WITH DIFFERENTIAL/PLATELET    Imaging Review No results found. I have personally reviewed and evaluated these lab results as part of my medical decision-making.   EKG Interpretation None      Filed Vitals:   02/20/16 1336 02/20/16 1638  BP: 140/86 125/89  Pulse: 80 60  Temp: 97.5 F (36.4 C)   TempSrc: Oral   Resp: 18 18  Height:  (1.88 m)   Weight: 90.719 kg   SpO2: 100% 98%     MDM   Final diagnoses:  Abscess    Patient with perirectal abscess on exam. Will obtain CT of his pelvis to further evaluate the abscess. Concern for possible need for incision and drainage in the OR. While awaiting CT scan the patient left the emergency department to smoke. I had the patient return to the room by security. I educated the patient on the need for him to stay in the emergency department while he has an IV. Patient agrees to plan. At shift change patient is awaiting CT pelvis. Patient care handed off to [facility], PA-C at shift change and she will disposition the patient accordingly.    Blake Farrier, PA-C 02/20/16 1712  Lavera Guise, MD 02/21/16 [phone]

## 2016-02-20 NOTE — ED Notes (Signed)
Pt leaving the unit at this time, stating he has to pick up his daughter and that his girlfriend is staying to receive the discharge paperwork and instructions.

## 2016-02-20 NOTE — ED Notes (Signed)
Abscess on tailbone x several years.  Reports that he has a surgical consult but has never f/u.

## 2016-02-20 NOTE — ED Notes (Addendum)
Pt leaving his room, stating he's going outside to smoke. Pt informed smoking isn't allowed on campus and that he needs to return to his room to complete his treatment. Pt refused and proceeded out the door, ignoring multiple staff members who were requesting he remain in the building. Security notified and officer went and spoke to pt outside and walked with pt back into the building. Pt educated by PA-C the importance of remaining inside the building during the entirety of the ER visit. Plan of care: frequent rounding and keeping pt informed of progress.

## 2016-02-20 NOTE — Discharge Instructions (Signed)
Perirectal Abscess An abscess is an infected area that contains a collection of pus. A perirectal abscess is an abscess that is near the opening of the anus or around the rectum. A perirectal abscess can cause a lot of pain, especially during bowel movements. CAUSES This condition is almost always caused by an infection that starts in an anal gland. RISK FACTORS This condition is more likely to develop in:  People with diabetes or inflammatory bowel disease.  People whose body defense system (immune system) is weak.  People who have anal sex.  People who have a sexually transmitted disease (STD).  People who have certain kinds of cancers, such as rectal carcinoma, leukemia, or lymphoma. SYMPTOMS The main symptom of this condition is pain. The pain may be a throbbing pain that gets worse during bowel movements. Other symptoms include:  Fever.  Swelling.  Redness.  Bleeding.  Constipation. DIAGNOSIS The condition is diagnosed with a physical exam. If the abscess is not visible, a health care provider may need to place a finger inside the rectum to find the abscess. Sometimes, imaging tests are done to determine the size and location of the abscess. These tests may include:  An ultrasound.  An MRI.  A CT scan. TREATMENT This condition is usually treated with incision and drainage surgery. Incision and drainage surgery involves making an incision over the abscess to drain the pus. Treatment may also involve antibiotic medicine, pain medicine, stool softeners, or laxatives. HOME CARE INSTRUCTIONS  Take medicines only as directed by your health care provider.  If you were prescribed an antibiotic, finish all of it even if you start to feel better.  To relieve pain, try sitting:  In a warm, shallow bath (sitz bath).  On a heating pad with the setting on low.  On an inflatable donut-shaped cushion.  Follow any diet instructions as directed by your health care  provider.  Keep all follow-up visits as directed by your health care provider. This is important. SEEK MEDICAL CARE IF:  Your abscess is bleeding.  You have pain, swelling, or redness that is getting worse.  You are constipated.  You feel ill.  You have muscle aches or chills.  You have a fever.  Your symptoms return after the abscess has healed.   This information is not intended to replace advice given to you by your health care provider. Make sure you discuss any questions you have with your health care provider.   Document Released: 09/01/2000 Document Revised: 05/26/2015 Document Reviewed: 07/15/2014 [employer] Interactive Patient Education 2016 [employer]  

## 2016-02-20 NOTE — ED Provider Notes (Signed)
Care assumed from Will Dansie, PA-C at shift change.  Briefly, patient presents with abscess near rectum over the last 2-3 days.  Pain with BMs.  No fevers.  Has seen general surgery in the past, but not followed up.  No insurance.  Patient has fluctuant mass above his rectum.  DRE reveals fluctuant tender mass at 12:00 position.  No drainage.  VSS, NAD.  Labs without acute abnormalities.  Patient received IVF and morphine for pain control.  CT abdomen/pelvis shows recently drained abscess with NO residual drainable fluid collection.  Patient given follow up with General surgery as well as [facility].  Discharged home with Flagyl and Levofloxacin and Naproxen for pain control.  Discussed return precautions.  Patient agrees and acknowledges the above plan for discharge.   Cheri FowlerKayla Mihcael Ledee, PA-C 02/20/16 1811  Laurence Spatesachel Morgan Little, MD 02/21/16 [id number]

## 2016-02-20 NOTE — ED Notes (Signed)
Patient transported to CT 

## 2016-04-19 ENCOUNTER — Emergency Department (HOSPITAL_BASED_OUTPATIENT_CLINIC_OR_DEPARTMENT_OTHER)
Admission: EM | Admit: 2016-04-19 | Discharge: 2016-04-19 | Disposition: A | Discharge status: Home / Self Care | Payer: Self-pay | Attending: Emergency Medicine | Admitting: Emergency Medicine

## 2016-04-19 ENCOUNTER — Encounter (HOSPITAL_BASED_OUTPATIENT_CLINIC_OR_DEPARTMENT_OTHER): Payer: Self-pay

## 2016-04-19 DIAGNOSIS — F17210 Nicotine dependence, cigarettes, uncomplicated: Secondary | ICD-10-CM | POA: Insufficient documentation

## 2016-04-19 DIAGNOSIS — L03112 Cellulitis of left axilla: Secondary | ICD-10-CM

## 2016-04-19 MED ORDER — TRAMADOL HCL 50 MG PO TABS
50.0000 mg | ORAL_TABLET | Freq: Once | ORAL | Status: AC
  Administered 2016-04-19: 50 mg via ORAL
  Filled 2016-04-19: qty 1

## 2016-04-19 MED ORDER — SULFAMETHOXAZOLE-TRIMETHOPRIM 800-160 MG PO TABS: 1.0000 | ORAL_TABLET | Freq: Two times a day (BID) | ORAL | 0 refills | Status: AC

## 2016-04-19 NOTE — ED Provider Notes (Signed)
[facility] Note   CSN: [id number] Arrival date & time: 04/19/16  1658  First Provider Contact:  First MD Initiated Contact with Patient 04/19/16 1834   By signing my name below, I, Bridgette Habermann, attest that this documentation has been prepared under the direction and in the presence of Samantha Dowless, PA-C. Electronically Signed: Bridgette Habermann, ED Scribe. 04/19/16. 6:40 PM.  History   Chief Complaint Chief Complaint  Patient presents with  . Abscess   HPI Comments: Blake Green is a 35 y.o. male who presents to the Emergency Department complaining of bilateral axilla abscesses onset two days ago. Pt notes he can't keep his arms down secondary to the pain. Pt states pain is exacerbated with palpation and movement. Pt notes his job requires him to lift his arms up and down a lot, which increases the pain. Denies any drainage. Pt has taken Ibuprofen every 6-8 hours with mild relief. Pt has had h/o similar symptoms before when he was younger. Denies any insect bites. Pt had h/o Hepatitis C which is resolved. Denies IV drug use. Pt is in no acute distress. Pt denies fever.   The history is provided by the patient. No language interpreter was used.   Past Medical History:  Diagnosis Date  . Cocaine abuse   . Episodic mood disorder (HCC)   . H/O: substance abuse   . HOH (hard of hearing)   . Impulse control disorder, unspecified   . Lyme disease   . Meningitis   . Neck pain   . Post traumatic stress disorder (PTSD)     Patient Active Problem List   Diagnosis Date Noted  . Opioid dependence (HCC) 12/06/2012  . Cocaine abuse 12/06/2012  . PTSD (post-traumatic stress disorder) 12/06/2012  . Unspecified episodic mood disorder 12/06/2012  . Impulse control disorder, unspecified 12/06/2012    Past Surgical History:  Procedure Laterality Date  . SKIN GRAFT       Home Medications    Prior to Admission medications   Not on File    Family History No  family history on file.  Social History Social History  Substance Use Topics  . Smoking status: Current Every Day Smoker    Packs/day: 0.50    Types: Cigarettes  . Smokeless tobacco: Never Used  . Alcohol use No     Allergies   Tylenol [acetaminophen]   Review of Systems Review of Systems  Constitutional: Negative for fever.  All other systems reviewed and are negative.  Physical Exam Updated Vital Signs BP 143/100 (BP Location: Right Arm)   Pulse 77   Temp 97.7 F (36.5 C) (Oral)   Resp 18   Ht 6\' 1"  (1.854 m)   Wt 200 lb (90.7 kg)   SpO2 100%   BMI 26.39 kg/m   Physical Exam  Constitutional: He is oriented to person, place, and time. He appears well-developed and well-nourished. No distress.  HENT:  Head: Normocephalic and atraumatic.  Eyes: Conjunctivae are normal. Right eye exhibits no discharge. Left eye exhibits no discharge. No scleral icterus.  Cardiovascular: Normal rate.   Pulmonary/Chest: Effort normal.  Neurological: He is alert and oriented to person, place, and time. Coordination normal.  Skin: Skin is warm and dry. No rash noted. He is not diaphoretic. No erythema. No pallor.  TTP of R axilla. However, no lymphadenopathy, no skin changes, no ecchymosis.   TTP of L axilla. L>R. Small area of erythema without any induration, fluctuance or lymphadenopathy. No active  drainage. No warmth. No obvious abscess.  Psychiatric: He has a normal mood and affect. His behavior is normal.  Nursing note and vitals reviewed.  ED Treatments / Results  DIAGNOSTIC STUDIES: Oxygen Saturation is 100% on RA, normal by my interpretation.    COORDINATION OF CARE: 6:37 PM Discussed treatment plan with pt at bedside which includes Rx of antibiotics and pt agreed to plan.  Labs (all labs ordered are listed, but only abnormal results are displayed) Labs Reviewed - No data to display  EKG  EKG Interpretation None       Radiology No results  found.  Procedures Procedures (including critical care time)  Medications Ordered in ED Medications - No data to display   Initial Impression / Assessment and Plan / ED Course  I have reviewed the triage vital signs and the nursing notes.  Pertinent labs & imaging results that were available during my care of the patient were reviewed by me and considered in my medical decision making (see chart for details).  Clinical Course   35 year old male with history of polysubstance abuse presents the ED today complaining of abscesses to his bilateral axilla. On exam, no abscess or lymphadenopathy seen or palpated. There is a small, 1 cm area of erythema on the left axilla but no fluctuance or drainable abscess seen. Patient does have history of multiple abscesses upon chart review. Will treat with antibiotics and encourage warm compresses. Instructed patient to avoid using deodorant.   Final Clinical Impressions(s) / ED Diagnoses   Final diagnoses:  Cellulitis of left axilla    New Prescriptions New Prescriptions   No medications on file  I personally performed the services described in this documentation, which was scribed in my presence. The recorded information has been reviewed and is accurate.      Lester Kinsman [place], PA-C 04/20/16 1720    Zadie Rhine, MD 04/20/16 [phone]

## 2016-04-19 NOTE — Discharge Instructions (Signed)
Take antibiotics as prescribed. Continue taking home ibuprofen as needed for pain. The antibiotics should start to clear the infection which will also improve your pain. Apply warm compresses. Avoid using deodorant. Return to this department if you experience significant worsening of your symptoms, increased redness or swelling in her armpit, fevers, chills.

## 2016-04-19 NOTE — ED Triage Notes (Signed)
C/o bilat axilla "boils" x 2-3 days-NAD-steady gait

## 2016-04-27 ENCOUNTER — Emergency Department (HOSPITAL_COMMUNITY): Payer: Self-pay

## 2016-04-27 ENCOUNTER — Emergency Department (HOSPITAL_COMMUNITY)
Admission: EM | Admit: 2016-04-27 | Discharge: 2016-04-28 | Disposition: A | Discharge status: Home / Self Care | Payer: Self-pay | Attending: Emergency Medicine | Admitting: Emergency Medicine

## 2016-04-27 ENCOUNTER — Encounter (HOSPITAL_COMMUNITY): Payer: Self-pay | Admitting: Emergency Medicine

## 2016-04-27 DIAGNOSIS — Y999 Unspecified external cause status: Secondary | ICD-10-CM | POA: Insufficient documentation

## 2016-04-27 DIAGNOSIS — F1414 Cocaine abuse with cocaine-induced mood disorder: Secondary | ICD-10-CM | POA: Insufficient documentation

## 2016-04-27 DIAGNOSIS — F1120 Opioid dependence, uncomplicated: Secondary | ICD-10-CM | POA: Diagnosis present

## 2016-04-27 DIAGNOSIS — Y939 Activity, unspecified: Secondary | ICD-10-CM | POA: Insufficient documentation

## 2016-04-27 DIAGNOSIS — F1494 Cocaine use, unspecified with cocaine-induced mood disorder: Secondary | ICD-10-CM

## 2016-04-27 DIAGNOSIS — Z791 Long term (current) use of non-steroidal anti-inflammatories (NSAID): Secondary | ICD-10-CM | POA: Insufficient documentation

## 2016-04-27 DIAGNOSIS — W010XXA Fall on same level from slipping, tripping and stumbling without subsequent striking against object, initial encounter: Secondary | ICD-10-CM | POA: Insufficient documentation

## 2016-04-27 DIAGNOSIS — F17210 Nicotine dependence, cigarettes, uncomplicated: Secondary | ICD-10-CM | POA: Insufficient documentation

## 2016-04-27 DIAGNOSIS — R109 Unspecified abdominal pain: Secondary | ICD-10-CM | POA: Insufficient documentation

## 2016-04-27 DIAGNOSIS — Y929 Unspecified place or not applicable: Secondary | ICD-10-CM | POA: Insufficient documentation

## 2016-04-27 LAB — COMPREHENSIVE METABOLIC PANEL
ALK PHOS: 74 U/L (ref 38–126)
ALT: 20 U/L (ref 17–63)
AST: 17 U/L (ref 15–41)
Albumin: 3.8 g/dL (ref 3.5–5.0)
Anion gap: 9 (ref 5–15)
BILIRUBIN TOTAL: 0.5 mg/dL (ref 0.3–1.2)
BUN: 12 mg/dL (ref 6–20)
CALCIUM: 9 mg/dL (ref 8.9–10.3)
CO2: 21 mmol/L — ABNORMAL LOW (ref 22–32)
CREATININE: 0.91 mg/dL (ref 0.61–1.24)
Chloride: 109 mmol/L (ref 101–111)
Glucose, Bld: 97 mg/dL (ref 65–99)
Potassium: 3.5 mmol/L (ref 3.5–5.1)
Sodium: 139 mmol/L (ref 135–145)
TOTAL PROTEIN: 6.8 g/dL (ref 6.5–8.1)

## 2016-04-27 LAB — URINALYSIS, ROUTINE W REFLEX MICROSCOPIC
Bilirubin Urine: NEGATIVE
Glucose, UA: NEGATIVE mg/dL
HGB URINE DIPSTICK: NEGATIVE
Ketones, ur: NEGATIVE mg/dL
Leukocytes, UA: NEGATIVE
Nitrite: NEGATIVE
PH: 6 (ref 5.0–8.0)
Protein, ur: NEGATIVE mg/dL
SPECIFIC GRAVITY, URINE: 1.028 (ref 1.005–1.030)

## 2016-04-27 LAB — CBC WITH DIFFERENTIAL/PLATELET
BASOS ABS: 0 10*3/uL (ref 0.0–0.1)
BASOS PCT: 0 %
EOS ABS: 0.1 10*3/uL (ref 0.0–0.7)
EOS PCT: 2 %
HCT: 42.2 % (ref 39.0–52.0)
Hemoglobin: 13.6 g/dL (ref 13.0–17.0)
LYMPHS PCT: 23 %
Lymphs Abs: 2 10*3/uL (ref 0.7–4.0)
MCH: 26.2 pg (ref 26.0–34.0)
MCHC: 32.2 g/dL (ref 30.0–36.0)
MCV: 81.2 fL (ref 78.0–100.0)
MONO ABS: 0.7 10*3/uL (ref 0.1–1.0)
Monocytes Relative: 8 %
Neutro Abs: 5.8 10*3/uL (ref 1.7–7.7)
Neutrophils Relative %: 67 %
PLATELETS: 256 10*3/uL (ref 150–400)
RBC: 5.2 MIL/uL (ref 4.22–5.81)
RDW: 13.4 % (ref 11.5–15.5)
WBC: 8.6 10*3/uL (ref 4.0–10.5)

## 2016-04-27 LAB — RAPID URINE DRUG SCREEN, HOSP PERFORMED
Amphetamines: NOT DETECTED
BENZODIAZEPINES: NOT DETECTED
Barbiturates: NOT DETECTED
Cocaine: POSITIVE — AB
OPIATES: NOT DETECTED
Tetrahydrocannabinol: NOT DETECTED

## 2016-04-27 LAB — ETHANOL

## 2016-04-27 MED ORDER — QUETIAPINE FUMARATE 100 MG PO TABS
100.0000 mg | ORAL_TABLET | Freq: Every day | ORAL | Status: DC
  Administered 2016-04-27: 100 mg via ORAL
  Filled 2016-04-27: qty 1

## 2016-04-27 MED ORDER — NAPROXEN 500 MG PO TABS
500.0000 mg | ORAL_TABLET | Freq: Two times a day (BID) | ORAL | Status: DC | PRN
Start: 2016-04-27 — End: 2016-04-28
  Administered 2016-04-27 – 2016-04-28 (×2): 500 mg via ORAL
  Filled 2016-04-27 (×2): qty 1

## 2016-04-27 MED ORDER — HYDROXYZINE HCL 25 MG PO TABS
25.0000 mg | ORAL_TABLET | ORAL | Status: DC | PRN
  Administered 2016-04-27 – 2016-04-28 (×4): 25 mg via ORAL
  Filled 2016-04-27 (×4): qty 1

## 2016-04-27 MED ORDER — CLONIDINE HCL 0.1 MG PO TABS: 0.1000 mg | ORAL_TABLET | ORAL | Status: DC

## 2016-04-27 MED ORDER — DICYCLOMINE HCL 20 MG PO TABS
20.0000 mg | ORAL_TABLET | Freq: Four times a day (QID) | ORAL | Status: DC | PRN
  Administered 2016-04-27: 20 mg via ORAL
  Filled 2016-04-27: qty 1

## 2016-04-27 MED ORDER — KETOROLAC TROMETHAMINE 60 MG/2ML IM SOLN
60.0000 mg | Freq: Once | INTRAMUSCULAR | Status: AC
  Administered 2016-04-27: 60 mg via INTRAMUSCULAR
  Filled 2016-04-27: qty 2

## 2016-04-27 MED ORDER — CLONIDINE HCL 0.1 MG PO TABS: 0.1000 mg | ORAL_TABLET | Freq: Every day | ORAL | Status: DC

## 2016-04-27 MED ORDER — ONDANSETRON 4 MG PO TBDP: 4.0000 mg | ORAL_TABLET | Freq: Four times a day (QID) | ORAL | Status: DC | PRN

## 2016-04-27 MED ORDER — NAPROXEN 500 MG PO TABS: 500.0000 mg | ORAL_TABLET | Freq: Two times a day (BID) | ORAL | Status: DC

## 2016-04-27 MED ORDER — QUETIAPINE FUMARATE 50 MG PO TABS: 50.0000 mg | ORAL_TABLET | Freq: Every day | ORAL | Status: DC

## 2016-04-27 MED ORDER — LOPERAMIDE HCL 2 MG PO CAPS: 2.0000 mg | ORAL_CAPSULE | ORAL | Status: DC | PRN

## 2016-04-27 MED ORDER — OXYCODONE HCL 5 MG PO TABS
5.0000 mg | ORAL_TABLET | Freq: Once | ORAL | Status: DC
  Filled 2016-04-27: qty 1

## 2016-04-27 MED ORDER — CLONIDINE HCL 0.1 MG PO TABS
0.1000 mg | ORAL_TABLET | Freq: Four times a day (QID) | ORAL | Status: DC
  Administered 2016-04-27 – 2016-04-28 (×4): 0.1 mg via ORAL
  Filled 2016-04-27 (×4): qty 1

## 2016-04-27 MED ORDER — METHOCARBAMOL 500 MG PO TABS
500.0000 mg | ORAL_TABLET | Freq: Three times a day (TID) | ORAL | Status: DC | PRN
  Administered 2016-04-27 – 2016-04-28 (×2): 500 mg via ORAL
  Filled 2016-04-27 (×2): qty 1

## 2016-04-27 NOTE — ED Provider Notes (Signed)
[facility]-EMERGENCY DEPT Provider Note   CSN: [id number] Arrival date & time: 04/27/16  4098  First Provider Contact:  First MD Initiated Contact with Patient 04/27/16 0255     By signing my name below, I, Freida Busman, attest that this documentation has been prepared under the direction and in the presence of Alvira Monday, MD . Electronically Signed: Freida Busman, Scribe. 04/27/2016. 2:56 AM.   History   Chief Complaint Chief Complaint  Patient presents with  . Back Pain     The history is provided by the patient. No language interpreter was used.    HPI Comments:  Blake Green is a 35 y.o. male who presents to the Emergency Department s/p fall ~ 0630 yesterday AM. He is complaining of constant, lower back pain that radiates down his BLE following the incident. Pt states he slipped and fell down the stairs. He denies vomiting. No alleviating factors noted.  Reports mild elbow pain and severe lower back pain, no other injuries.  Denies IV drug use, denies loss control bowel/bladder.  Pt is a poor historian at this time, is somnolent, falling asleep frequently and needing to be stimulated to discuss history.  Reports taking seroquel to help with pain.  When I tell the patient he appears to sleepy to have narcotic medications, he opens his eyes wide open, and reports that he needs something stronger for pain.  On reevaluation, patient is more awake. Reports he took Seroquel 300 mg and he believes about tizanidine 150mg  in order to help his pain and sleep.  He does, at this time reports that he had told someone initially, that he wanted to have a psychiatric evaluation. He is not clear to me about why this is. When I ask him if he has suicidal ideation, he reports "no, but I do want to hurt others." When I ask him specifically if he has a plan, he states, "well what if I do?" and who it is that he wants to hurt, he states "I will not tell you that." and "why would I tell you that."  He  will not provide other details about this. He also acknowledges auditory hallucinations, however she will not discuss this more with me. Denies any current suicidal ideation, however reports he's had suicide attempts in the past.      Past Medical History:  Diagnosis Date  . Cocaine abuse   . Episodic mood disorder (HCC)   . H/O: substance abuse   . HOH (hard of hearing)   . Impulse control disorder, unspecified   . Lyme disease   . Meningitis   . Neck pain   . Post traumatic stress disorder (PTSD)     Patient Active Problem List   Diagnosis Date Noted  . Opioid dependence (HCC) 12/06/2012  . Cocaine abuse 12/06/2012  . PTSD (post-traumatic stress disorder) 12/06/2012  . Unspecified episodic mood disorder 12/06/2012  . Impulse control disorder, unspecified 12/06/2012    Past Surgical History:  Procedure Laterality Date  . SKIN GRAFT         Home Medications    Prior to Admission medications   Medication Sig Start Date End Date Taking? Authorizing Provider  sulfamethoxazole-trimethoprim (BACTRIM DS,SEPTRA DS) 800-160 MG tablet Take 1 tablet by mouth 2 (two) times daily. 04/19/16 04/29/16  Samantha Tripp Dowless, PA-C    Family History History reviewed. No pertinent family history.  Social History Social History  Substance Use Topics  . Smoking status: Current Every Day Smoker  Packs/day: 0.50    Types: Cigarettes  . Smokeless tobacco: Never Used  . Alcohol use No     Allergies   Tylenol [acetaminophen]   Review of Systems Review of Systems  Gastrointestinal: Negative for vomiting.  Musculoskeletal: Positive for back pain.  All other systems reviewed and are negative.    Physical Exam Updated Vital Signs BP 107/62 (BP Location: Right Arm)   Pulse 92   Temp 98.4 F (36.9 C) (Oral)   Resp 18   SpO2 97%   Physical Exam  Constitutional: He is oriented to person, place, and time. He appears well-developed and well-nourished. No distress.  Sleepy,  however when aroused, screams out in pain, writhing in bed, kicking legs  HENT:  Head: Normocephalic.  Eyes: Conjunctivae and EOM are normal.  Neck: Normal range of motion.  Cardiovascular: Normal rate, regular rhythm, normal heart sounds and intact distal pulses.  Exam reveals no gallop and no friction rub.   No murmur heard. Pulmonary/Chest: Effort normal and breath sounds normal. No respiratory distress. He has no wheezes. He has no rales.  Abdominal: Soft. He exhibits no distension. There is no tenderness. There is no guarding.  Musculoskeletal: He exhibits tenderness. He exhibits no edema.       Cervical back: He exhibits no tenderness and no bony tenderness.       Thoracic back: He exhibits no tenderness and no bony tenderness.       Lumbar back: He exhibits tenderness and bony tenderness.       Left forearm: He exhibits tenderness. He exhibits no swelling, no edema, no deformity and no laceration.  Severe midline and bilateral paraspinal tenderness  Neurological: He is alert and oriented to person, place, and time. He has normal strength. No sensory deficit. GCS eye subscore is 3. GCS verbal subscore is 5. GCS motor subscore is 6.  Skin: Skin is warm and dry. He is not diaphoretic.  Erythema left forearm  Nursing note and vitals reviewed.   ED Treatments / Results  DIAGNOSTIC STUDIES:  Oxygen Saturation is 97% on RA, normal by my interpretation.    COORDINATION OF CARE:  2:56 AM Discussed treatment plan with pt at bedside and pt agreed to plan.  Labs (all labs ordered are listed, but only abnormal results are displayed) Labs Reviewed - No data to display  EKG  EKG Interpretation None       Radiology No results found.  Procedures Procedures (including critical care time)  Medications Ordered in ED Medications - No data to display   Initial Impression / Assessment and Plan / ED Course  I have reviewed the triage vital signs and the nursing notes.  Pertinent  labs & imaging results that were available during my care of the patient were reviewed by me and considered in my medical decision making (see chart for details).  Clinical Course   35 year old male with a history of episodic mood disorder, polysubstance abuse, cocaine use, PTSD, prior Lyme disease and meningitis, bipolar disorder per patient, who presents with concern of back pain, however then acknowledges homicidal ideation and auditory hallucinations.  Regarding back pain, patient has a normal neurologic exam and denies any urinary retention or overflow incontinence, stool incontinence, saddle anesthesia, fever, IV drug (although records indicate this in past) use chronic steroid use or immunocompromise and have low suspicion suspicion for cauda equina,  epidural abscess, or vertebral osteomyelitis. X-ray of the lumbar spine shows no acute abnormalities.  Patient is afebrile, denies a history  of IV drug use, has normal exam, and overall have low suspicion for epidural abscess with pain starting at time of fall.  Given concern of fall, and colicky pain with patient writhing in bed, will order CT stone study to evaluate for nephrolithiasis or fx.  Patient sleepy on exam initially secondary to taking seroquel, however appears to be improving.  Given toradol for pain.  Pt frequently requesting narcotics, even while somnolent.  Doubt acute intracranial bleed by hx, exam, no sign of head trauma.  Pt improving but still requesting narcotic medication and writhing in bed.  CT pending.   Patient made statements reporting that he has thoughts of hurting someone, however is not specific regarding his plan or who it is. He does report is under a lot of stress recently. He also reports auditory hallucinations, however does not describe these. Given history is limited, and concern for the patient's safety and safety of others, place and involuntary commitment, and consulted TTS.    Final Clinical Impressions(s)  / ED Diagnoses   Final diagnoses:  None    New Prescriptions New Prescriptions   No medications on file   I personally performed the services described in this documentation, which was scribed in my presence. The recorded information has been reviewed and is accurate.    Alvira Monday, MD 04/27/16 [phone]

## 2016-04-27 NOTE — ED Notes (Signed)
Pt cannot use restroom at this time, aware urine specimen is needed.  

## 2016-04-27 NOTE — ED Provider Notes (Signed)
Pt visit shared.  He has been medically cleared for psychiatric treatment.     Tilden FossaElizabeth Nathanie Ottley, MD 04/27/16 1601

## 2016-04-27 NOTE — ED Notes (Signed)
Pt in X-Ray ?

## 2016-04-27 NOTE — ED Notes (Signed)
Writer unable to get vitals on pt due to pt being uncooperative.  Pt was moving around would not let this writer put BP cuff on him stating he "was in pain too much and needed something for it".

## 2016-04-27 NOTE — BH Assessment (Addendum)
Assessment Note  Blake Green is a 35 y.o. male who presented to [facility] at @ 2am with c/o lower back pain in relation to a fall down the stairs. Upon re-assessment by EDP Schlossman, pt advised that he wanted to hurt people and he also endorsed AH. Pt refused to elaborate any further concerning these statements. EDP Schlossman indicated that she planned on IVCing pt, based on his comments.  Upon initial attempt to see pt, RN and lab tech were in the room and pt was asking for pain medications. When told he could not get any at that moment, he told the RN to stop bothering him if he couldn't get any pain meds. Writer returned to the room after RN and lab tech had finished tending to pt. Pt was lying in the bed with the covers over his entire body. Writer called pt's name several times with no response from pt. Writer then lightly tapped pt's arm to rouse him and pt made a noncommittal response. Writer introduced self and reason for visit. Pt indicated that he had not gotten the pain medication that he had been requesting and was not able to help writer, as a result. After making this statement, pt refused to answer any questions for writer or even respond to Clinical research associate. Pt also never removed the cover from his body/head.   Diagnosis: TBD  Past Medical History:  Past Medical History:  Diagnosis Date  . Cocaine abuse   . Episodic mood disorder (HCC)   . H/O: substance abuse   . HOH (hard of hearing)   . Impulse control disorder, unspecified   . Lyme disease   . Meningitis   . Neck pain   . Post traumatic stress disorder (PTSD)     Past Surgical History:  Procedure Laterality Date  . SKIN GRAFT      Family History: History reviewed. No pertinent family history.  Social History:  reports that he has been smoking Cigarettes.  He has been smoking about 0.50 packs per day. He has never used smokeless tobacco. He reports that he does not drink alcohol or use drugs.  Additional Social History:      CIWA: CIWA-Ar BP: 123/67 Pulse Rate: 72 COWS:    Allergies:  Allergies  Allergen Reactions  . Tylenol [Acetaminophen] Rash    Home Medications:  (Not in a hospital admission)  OB/GYN Status:  No LMP for male patient.  General Assessment Data Location of Assessment: [facility] TTS Assessment: In system Is this a Tele or Face-to-Face Assessment?: Face-to-Face Is this an Initial Assessment or a Re-assessment for this encounter?: Initial Assessment           Risk to self with the past 6 months Is patient at risk for suicide?: No, but patient needs Medical Clearance  Risk to Others within the past 6 months Thoughts of Harm to Others: Yes-Currently Present Comment - Thoughts of Harm to Others: pt disclosed to EDP Schlossman that he wanted to harm others. Pt refused to give any further information regarding this.  Psychosis Hallucinations: Auditory (pt disclosed experiencing AH to EDP Schlossman)  Mental Status Report Appearance/Hygiene: Unable to Assess (pt was completed covered by his cover & refused to remove it) Eye Contact: Poor Motor Activity: Unremarkable Speech: Elective mutism Level of Consciousness: Quiet/awake Mood: Irritable Affect: Unable to Assess Thought Processes: Unable to Assess Judgement: Unable to Assess Orientation: Unable to assess Obsessive Compulsive Thoughts/Behaviors: Unable to Assess  Cognitive Functioning Concentration: Unable to Assess Memory: Unable  to Assess Insight: Unable to Assess Impulse Control: Unable to Assess Vegetative Symptoms: Unable to Assess  ADLScreening [facility]) Patient's cognitive ability adequate to safely complete daily activities?: Yes Patient able to express need for assistance with ADLs?: Yes Independently performs ADLs?: Yes (appropriate for developmental age)  Prior Inpatient Therapy Prior Inpatient Therapy: Yes Prior Therapy Dates: 2014 [facility] Reason for  Treatment: Opioid dependence; cocaine abuse  Prior Outpatient Therapy Does patient have an ACCT team?: Unknown Does patient have Intensive In-House Services?  : Unknown Does patient have [facility] services? : Unknown Does patient have P4CC services?: Unknown  ADL Screening (condition at time of admission) Patient's cognitive ability adequate to safely complete daily activities?: Yes Patient able to express need for assistance with ADLs?: Yes Independently performs ADLs?: Yes (appropriate for developmental age)             Advance Directives (For Healthcare) Does patient have an advance directive?: No Would patient like information on creating an advanced directive?: No - patient declined information    Additional Information 1:1 In Past 12 Months?: No Elopement Risk: No Does patient have medical clearance?: No     Disposition:  Disposition Initial Assessment Completed for this Encounter: Yes (consulted with Alberteen SamFran Hobson, NP) Disposition of Patient:  (Pt will be assessed by psychiatry)  On Site Evaluation by:   Reviewed with Physician:    Laddie AquasSamantha M Anacleto Batterman 04/27/2016 10:30 AM

## 2016-04-27 NOTE — Progress Notes (Signed)
04/27/16 1350:  Pt did not participate in activity.   Deetta Siegmann, LRT/CTRS 

## 2016-04-27 NOTE — ED Notes (Signed)
Patient intermittently cooperative and irritable.  He has been started on Clonidine protocol.  He has been in his room most of the shift, but has been up interacting some with peers.  Tolerating his diet and medications well.

## 2016-04-27 NOTE — ED Notes (Signed)
Patient presentation to ED requesting narcotic pain medication and voicing homicidal ideation earlier today. However patient denies SI, HI and AVH at this current time. Plan of care discused with patient. Patient voices no complaints or concerns at this time. Encouragement and support provided and safety maintain. Q 15 min safety checks remain in place and video monitoring.

## 2016-04-27 NOTE — BH Assessment (Addendum)
[facility] Assessment Progress Note  Per Leata MouseJanardhana Jonnalagadda, MD, this pt would benefit from admission to a residential detox/rehab facility at this time.  He presents under IVC initiated by EDP Glenna DurandErin Schlossman,MD, which Dr Elsie SaasJonnalagadda has rescinded.  Dr Elsie SaasJonnalagadda requests that this writer seek placement for the pt, to which the pt also agrees.  The following facilities have been contacted with results as noted:  Beds available, information sent, decision pending:  [facility]  At capacity:  [facility]  If RTS determines that they are not able to accept pt at this time, it is recommended by Alberteen SamFran Hobson, NP, that he be [facility].  Berneice Heinrichina Tate, RN, [facility] and agrees to consider pt.  Doylene Canninghomas Rhyder Koegel, MA Triage Specialist [phone]

## 2016-04-27 NOTE — ED Notes (Signed)
MD at bedside. 

## 2016-04-27 NOTE — ED Triage Notes (Addendum)
Pt has lower back pain since yesterday morning after falling on the stairs.  Pt is agitated and not being very coopoerative, acting very strangely.  Pt stated he just woke up from falling yesterday. Pt is standing and walking around in and out of room and acting as if the injury he states he has is inconsistent with his actions.

## 2016-04-27 NOTE — ED Notes (Signed)
Patient transported to CT 
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
## 2016-04-27 NOTE — ED Notes (Signed)
Pt ambulating in triage, pt states "I am in pain and I need medicine now"; pt advised that he needs to be triaged before he can see the MD and that he needs to go to his room and wait to be seen; pt refusing to stay in his room and ambulating in hallway; EMS reports that pt was ambulating outside upon their arrival

## 2016-04-28 ENCOUNTER — Emergency Department (HOSPITAL_COMMUNITY): Payer: Self-pay

## 2016-04-28 DIAGNOSIS — F1120 Opioid dependence, uncomplicated: Secondary | ICD-10-CM

## 2016-04-28 DIAGNOSIS — F1414 Cocaine abuse with cocaine-induced mood disorder: Secondary | ICD-10-CM

## 2016-04-28 NOTE — [facility] Suicide Risk Assessment (Signed)
Suicide Risk Assessment  Discharge Assessment   [facility]   Principal Problem: Cocaine abuse with cocaine-induced mood disorder [facility](HCC) Discharge Diagnoses:  Patient Active Problem List   Diagnosis Date Noted  . Cocaine abuse with cocaine-induced mood disorder [facility](HCC) [F14.14] 04/27/2016    Priority: High  . Opioid dependence ([facility]) [F11.20] 12/06/2012    Priority: High  . PTSD (post-traumatic stress disorder) [F43.10] 12/06/2012  . Impulse control disorder, unspecified [F63.9] 12/06/2012    Total Time spent with patient: 45 minutes   Musculoskeletal: Strength & Muscle Tone: within normal limits Gait & Station: normal Patient leans: N/A  Psychiatric Specialty Exam: Physical Exam  Constitutional: He is oriented to person, place, and time. He appears well-developed and well-nourished.  HENT:  Head: Normocephalic.  Respiratory: Effort normal.  Musculoskeletal: Normal range of motion.  Neurological: He is alert and oriented to person, place, and time.  Skin: Skin is warm and dry.  Psychiatric: He has a normal mood and affect. His speech is normal and behavior is normal. Judgment and thought content normal. Cognition and memory are normal.    Review of Systems  Constitutional: Negative.   HENT: Negative.   Eyes: Negative.   Respiratory: Negative.   Cardiovascular: Negative.   Gastrointestinal: Negative.   Genitourinary: Negative.   Musculoskeletal: Negative.   Skin: Negative.   Neurological: Negative.   Endo/Heme/Allergies: Negative.   Psychiatric/Behavioral: Positive for substance abuse.    Blood pressure 123/85, pulse 74, temperature 98.6 F (37 C), resp. rate 18, SpO2 100 %.There is no height or weight on file to calculate BMI.  General Appearance: Casual  Eye Contact:  Good  Speech:  Normal Rate  Volume:  Normal  Mood:  Euthymic  Affect:  Congruent  Thought Process:  Coherent and Descriptions of Associations: Intact  Orientation:  Full  (Time, Place, and Person)  Thought Content:  WDL  Suicidal Thoughts:  No  Homicidal Thoughts:  No  Memory:  Immediate;   Good Recent;   Good Remote;   Good  Judgement:  Fair  Insight:  Fair  Psychomotor Activity:  Normal  Concentration:  Concentration: Good and Attention Span: Good  Recall:  Good  Fund of Knowledge:  Good  Language:  Good  Akathisia:  No  Handed:  Right  AIMS (if indicated):     Assets:  Housing Leisure Time Physical Health Resilience Social Support  ADL's:  Intact  Cognition:  WNL  Sleep:      Mental Status Per Nursing Assessment::   On Admission:   cocaine and opioid abuse with homicidal ideations  Demographic Factors:  Male and Caucasian  Loss Factors: NA  Historical Factors: NA  Risk Reduction Factors:   Sense of responsibility to family, Living with another person, especially a relative and Positive social support  Continued Clinical Symptoms:  None  Cognitive Features That Contribute To Risk:  None    Suicide Risk:  Minimal: No identifiable suicidal ideation.  Patients presenting with no risk factors but with morbid ruminations; may be classified as minimal risk based on the severity of the depressive symptoms    Plan Of Care/Follow-up recommendations:  Activity:  as tolerated Diet:  heart healthy diet  Shamir Tuzzolino, NP 04/28/2016, 10:04 AM

## 2016-04-28 NOTE — ED Notes (Signed)
Patient reports increase anxiety at this time. Respirations equal and unlabored, skin warm and dry. NAD. Will medicated patient per MAR.
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
## 2016-04-28 NOTE — Discharge Instructions (Signed)
To help you maintain a sober lifestyle, a substance abuse treatment program may be beneficial to you.  Contact one of the following residential treatment providers to ask about enrolling in their program:       [facility]      [address]      [place], [place] [address]      [phone]       [facility]      [address]      [place], [place] [address]      [phone]

## 2016-04-28 NOTE — ED Notes (Signed)
Pt verbalized readiness for discharge. States that he feels better and said that we helped him. He said that he thinks he has been accepted to a 2-year residential treatment facility. All belongings returned to pt who signed for same. Pt given a bus pass and escorted to the exit.
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
## 2016-04-28 NOTE — BH Assessment (Signed)
[facility] Assessment Progress Note  Per Nanine MeansJamison Lord, DNP, this pt does not require psychiatric hospitalization at this time.  [facility] recommendation to follow up with area residential substance abuse treatment providers.  Discharge instructions include referral information for [facility] and [facility].  Pt's nurse, Diane, has been notified.  Blake Canninghomas Lasheika Ortloff, MA Triage Specialist [phone]

## 2016-05-01 ENCOUNTER — Emergency Department (HOSPITAL_BASED_OUTPATIENT_CLINIC_OR_DEPARTMENT_OTHER)
Admission: EM | Admit: 2016-05-01 | Discharge: 2016-05-01 | Disposition: A | Discharge status: Home / Self Care | Payer: Self-pay | Attending: Emergency Medicine | Admitting: Emergency Medicine

## 2016-05-01 ENCOUNTER — Encounter (HOSPITAL_BASED_OUTPATIENT_CLINIC_OR_DEPARTMENT_OTHER): Payer: Self-pay | Admitting: *Deleted

## 2016-05-01 DIAGNOSIS — Z791 Long term (current) use of non-steroidal anti-inflammatories (NSAID): Secondary | ICD-10-CM | POA: Insufficient documentation

## 2016-05-01 DIAGNOSIS — W010XXA Fall on same level from slipping, tripping and stumbling without subsequent striking against object, initial encounter: Secondary | ICD-10-CM | POA: Insufficient documentation

## 2016-05-01 DIAGNOSIS — Y929 Unspecified place or not applicable: Secondary | ICD-10-CM | POA: Insufficient documentation

## 2016-05-01 DIAGNOSIS — Y939 Activity, unspecified: Secondary | ICD-10-CM | POA: Insufficient documentation

## 2016-05-01 DIAGNOSIS — Y999 Unspecified external cause status: Secondary | ICD-10-CM | POA: Insufficient documentation

## 2016-05-01 DIAGNOSIS — S5002XA Contusion of left elbow, initial encounter: Secondary | ICD-10-CM | POA: Insufficient documentation

## 2016-05-01 DIAGNOSIS — S5002XD Contusion of left elbow, subsequent encounter: Secondary | ICD-10-CM

## 2016-05-01 DIAGNOSIS — F17210 Nicotine dependence, cigarettes, uncomplicated: Secondary | ICD-10-CM | POA: Insufficient documentation

## 2016-05-01 HISTORY — DX: Unspecified viral hepatitis C without hepatic coma: B19.20

## 2016-05-01 NOTE — Discharge Instructions (Signed)
Return to the ED with any concerns including increased pain, increased swelling or redness, or any other alarming symptoms  You should take ibuprofen three times daily with meals, apply ice as needed, keep elbow elevated

## 2016-05-01 NOTE — ED Notes (Signed)
Patient's wife has been in and out of the department multiple times   Came in and stated that the dog jumped out of the car and she needs the patient to come and help her get the dog.  Patient left the building stating he will be back.
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
## 2016-05-01 NOTE — ED Triage Notes (Signed)
Patient states he slipped and fell, landing on his left elbow.  Pain, redness, swelling noted.

## 2017-09-04 DIAGNOSIS — M6282 Rhabdomyolysis: Secondary | ICD-10-CM

## 2017-09-04 DIAGNOSIS — G9341 Metabolic encephalopathy: Secondary | ICD-10-CM

## 2017-09-04 DIAGNOSIS — E871 Hypo-osmolality and hyponatremia: Secondary | ICD-10-CM

## 2017-09-04 DIAGNOSIS — D72829 Elevated white blood cell count, unspecified: Secondary | ICD-10-CM

## 2017-09-04 DIAGNOSIS — N179 Acute kidney failure, unspecified: Secondary | ICD-10-CM

## 2017-09-04 DIAGNOSIS — R740 Nonspecific elevation of levels of transaminase and lactic acid dehydrogenase [LDH]: Secondary | ICD-10-CM

## 2017-09-04 DIAGNOSIS — F1910 Other psychoactive substance abuse, uncomplicated: Secondary | ICD-10-CM

## 2019-05-05 ENCOUNTER — Ambulatory Visit (INDEPENDENT_AMBULATORY_CARE_PROVIDER_SITE_OTHER): Payer: Self-pay | Admitting: Podiatry

## 2019-05-05 DIAGNOSIS — Z5329 Procedure and treatment not carried out because of patient's decision for other reasons: Secondary | ICD-10-CM

## 2019-05-05 NOTE — Progress Notes (Signed)
   Complete physical exam  Patient: Blake Green   DOB: 07/08/1997   38 y.o. Male  MRN: [id number]  Subjective:    No chief complaint on file.   Blake Green is a 38 y.o. male who presents today for a complete physical exam. She reports consuming a {diet types:17450} diet. {types:19826} She generally feels {DESC; WELL/FAIRLY WELL/POORLY:18703}. She reports sleeping {DESC; WELL/FAIRLY WELL/POORLY:18703}. She {does/does not:200015} have additional problems to discuss today.    Most recent fall risk assessment:    03/15/2022   10:42 AM  Fall Risk   Falls in the past year? 0  Number falls in past yr: 0  Injury with Fall? 0  Risk for fall due to : No Fall Risks  Follow up Falls evaluation completed     Most recent depression screenings:    03/15/2022   10:42 AM 02/03/2021   10:46 AM  PHQ 2/9 Scores  PHQ - 2 Score 0 0  PHQ- 9 Score 5     {VISON DENTAL STD PSA (Optional):27386}  {History (Optional):23778}  Patient Care Team: Jessup, Joy, NP as PCP - General (Nurse Practitioner)   Outpatient Medications Prior to Visit  Medication Sig   fluticasone (FLONASE) 50 MCG/ACT nasal spray Place 2 sprays into both nostrils in the morning and at bedtime. After 7 days, reduce to once daily.   norgestimate-ethinyl estradiol (SPRINTEC 28) 0.25-35 MG-MCG tablet Take 1 tablet by mouth daily.   Nystatin POWD Apply liberally to affected area 2 times per day   spironolactone (ALDACTONE) 100 MG tablet Take 1 tablet (100 mg total) by mouth daily.   No facility-administered medications prior to visit.    ROS        Objective:     There were no vitals taken for this visit. {Vitals History (Optional):23777}  Physical Exam   No results found for any visits on 04/20/22. {Show previous labs (optional):23779}    Assessment & Plan:    Routine Health Maintenance and Physical Exam  Immunization History  Administered Date(s) Administered   DTaP 09/21/1999, 11/17/1999,  01/26/2000, 10/11/2000, 04/26/2004   Hepatitis A 02/21/2008, 02/26/2009   Hepatitis B 07/09/1999, 08/16/1999, 01/26/2000   HiB (PRP-OMP) 09/21/1999, 11/17/1999, 01/26/2000, 10/11/2000   IPV 09/21/1999, 11/17/1999, 07/16/2000, 04/26/2004   Influenza,inj,Quad PF,6+ Mos 05/29/2014   Influenza-Unspecified 08/28/2012   MMR 07/16/2001, 04/26/2004   Meningococcal Polysaccharide 02/26/2012   Pneumococcal Conjugate-13 10/11/2000   Pneumococcal-Unspecified 01/26/2000, 04/10/2000   Tdap 02/26/2012   Varicella 07/16/2000, 02/21/2008    Health Maintenance  Topic Date Due   HIV Screening  Never done   Hepatitis C Screening  Never done   INFLUENZA VACCINE  04/18/2022   PAP-Cervical Cytology Screening  04/20/2022 (Originally 07/07/2020)   PAP SMEAR-Modifier  04/20/2022 (Originally 07/07/2020)   TETANUS/TDAP  04/20/2022 (Originally 02/25/2022)   HPV VACCINES  Discontinued   COVID-19 Vaccine  Discontinued    Discussed health benefits of physical activity, and encouraged her to engage in regular exercise appropriate for her age and condition.  Problem List Items Addressed This Visit   None Visit Diagnoses     Annual physical exam    -  Primary   Cervical cancer screening       Need for Tdap vaccination          No follow-ups on file.     Joy Jessup, NP   

## 2019-07-25 ENCOUNTER — Inpatient Hospital Stay: Admission: AD | Admit: 2019-07-25 | Payer: Self-pay | Admitting: Internal Medicine

## 2020-02-15 ENCOUNTER — Encounter (HOSPITAL_COMMUNITY): Payer: Self-pay | Admitting: *Deleted

## 2020-02-15 ENCOUNTER — Emergency Department (HOSPITAL_COMMUNITY): Payer: Medicaid Other

## 2020-02-15 ENCOUNTER — Inpatient Hospital Stay (HOSPITAL_COMMUNITY)
Admission: EM | Admit: 2020-02-15 | Discharge: 2020-02-18 | DRG: 041 | Disposition: A | Discharge status: Home / Self Care | Payer: Medicaid Other | Attending: General Surgery | Admitting: General Surgery

## 2020-02-15 ENCOUNTER — Inpatient Hospital Stay (HOSPITAL_COMMUNITY): Payer: Medicaid Other | Admitting: Anesthesiology

## 2020-02-15 ENCOUNTER — Encounter (HOSPITAL_COMMUNITY): Admission: EM | Disposition: A | Discharge status: Home / Self Care | Payer: Self-pay | Source: Home / Self Care

## 2020-02-15 DIAGNOSIS — Z886 Allergy status to analgesic agent status: Secondary | ICD-10-CM | POA: Diagnosis not present

## 2020-02-15 DIAGNOSIS — D62 Acute posthemorrhagic anemia: Secondary | ICD-10-CM | POA: Diagnosis present

## 2020-02-15 DIAGNOSIS — S42302A Unspecified fracture of shaft of humerus, left arm, initial encounter for closed fracture: Secondary | ICD-10-CM | POA: Diagnosis present

## 2020-02-15 DIAGNOSIS — B1920 Unspecified viral hepatitis C without hepatic coma: Secondary | ICD-10-CM | POA: Diagnosis present

## 2020-02-15 DIAGNOSIS — Z20822 Contact with and (suspected) exposure to covid-19: Secondary | ICD-10-CM | POA: Diagnosis present

## 2020-02-15 DIAGNOSIS — Z23 Encounter for immunization: Secondary | ICD-10-CM | POA: Diagnosis not present

## 2020-02-15 DIAGNOSIS — S5422XA Injury of radial nerve at forearm level, left arm, initial encounter: Secondary | ICD-10-CM | POA: Diagnosis present

## 2020-02-15 DIAGNOSIS — Z9911 Dependence on respirator [ventilator] status: Secondary | ICD-10-CM

## 2020-02-15 DIAGNOSIS — F1510 Other stimulant abuse, uncomplicated: Secondary | ICD-10-CM | POA: Diagnosis present

## 2020-02-15 DIAGNOSIS — S46392A Other injury of muscle, fascia and tendon of triceps, left arm, initial encounter: Secondary | ICD-10-CM

## 2020-02-15 DIAGNOSIS — T1490XA Injury, unspecified, initial encounter: Secondary | ICD-10-CM

## 2020-02-15 DIAGNOSIS — S5400XA Injury of ulnar nerve at forearm level, unspecified arm, initial encounter: Secondary | ICD-10-CM | POA: Diagnosis present

## 2020-02-15 DIAGNOSIS — S42402A Unspecified fracture of lower end of left humerus, initial encounter for closed fracture: Secondary | ICD-10-CM | POA: Diagnosis present

## 2020-02-15 DIAGNOSIS — Z888 Allergy status to other drugs, medicaments and biological substances status: Secondary | ICD-10-CM

## 2020-02-15 DIAGNOSIS — S46302A Unspecified injury of muscle, fascia and tendon of triceps, left arm, initial encounter: Secondary | ICD-10-CM | POA: Diagnosis present

## 2020-02-15 DIAGNOSIS — F10129 Alcohol abuse with intoxication, unspecified: Secondary | ICD-10-CM | POA: Diagnosis present

## 2020-02-15 DIAGNOSIS — S41112A Laceration without foreign body of left upper arm, initial encounter: Secondary | ICD-10-CM

## 2020-02-15 HISTORY — PX: I & D EXTREMITY: SHX5045

## 2020-02-15 LAB — CBC
HCT: 38.3 % — ABNORMAL LOW (ref 39.0–52.0)
HCT: 33.7 % — ABNORMAL LOW (ref 39.0–52.0)
Hemoglobin: 12.6 g/dL — ABNORMAL LOW (ref 13.0–17.0)
Hemoglobin: 10.6 g/dL — ABNORMAL LOW (ref 13.0–17.0)
MCH: 27.3 pg (ref 26.0–34.0)
MCH: 26.9 pg (ref 26.0–34.0)
MCHC: 32.9 g/dL (ref 30.0–36.0)
MCHC: 31.5 g/dL (ref 30.0–36.0)
MCV: 82.9 fL (ref 80.0–100.0)
MCV: 85.5 fL (ref 80.0–100.0)
Platelets: 279 10*3/uL (ref 150–400)
Platelets: 260 10*3/uL (ref 150–400)
RBC: 4.62 MIL/uL (ref 4.22–5.81)
RBC: 3.94 MIL/uL — ABNORMAL LOW (ref 4.22–5.81)
RDW: 12.7 % (ref 11.5–15.5)
RDW: 12.8 % (ref 11.5–15.5)
WBC: 12.4 10*3/uL — ABNORMAL HIGH (ref 4.0–10.5)
WBC: 9.2 10*3/uL (ref 4.0–10.5)
nRBC: 0 % (ref 0.0–0.2)
nRBC: 0 % (ref 0.0–0.2)

## 2020-02-15 LAB — BASIC METABOLIC PANEL
Anion gap: 6 (ref 5–15)
BUN: 18 mg/dL (ref 6–20)
CO2: 22 mmol/L (ref 22–32)
Calcium: 7.9 mg/dL — ABNORMAL LOW (ref 8.9–10.3)
Chloride: 112 mmol/L — ABNORMAL HIGH (ref 98–111)
Creatinine, Ser: 1.01 mg/dL (ref 0.61–1.24)
GFR calc Af Amer: >60 mL/min (ref >60)
GFR calc non Af Amer: >60 mL/min (ref >60)
Glucose, Bld: 116 mg/dL — ABNORMAL HIGH (ref 70–99)
Potassium: 5.7 mmol/L — ABNORMAL HIGH (ref 3.5–5.1)
Sodium: 140 mmol/L (ref 135–145)

## 2020-02-15 LAB — COMPREHENSIVE METABOLIC PANEL
ALT: 55 U/L — ABNORMAL HIGH (ref 0–44)
AST: 47 U/L — ABNORMAL HIGH (ref 15–41)
Albumin: 3.6 g/dL (ref 3.5–5.0)
Alkaline Phosphatase: 68 U/L (ref 38–126)
Anion gap: 12 (ref 5–15)
BUN: 20 mg/dL (ref 6–20)
CO2: 21 mmol/L — ABNORMAL LOW (ref 22–32)
Calcium: 8.6 mg/dL — ABNORMAL LOW (ref 8.9–10.3)
Chloride: 110 mmol/L (ref 98–111)
Creatinine, Ser: 1.31 mg/dL — ABNORMAL HIGH (ref 0.61–1.24)
GFR calc Af Amer: >60 mL/min (ref >60)
GFR calc non Af Amer: >60 mL/min (ref >60)
Glucose, Bld: 119 mg/dL — ABNORMAL HIGH (ref 70–99)
Potassium: 4.7 mmol/L (ref 3.5–5.1)
Sodium: 143 mmol/L (ref 135–145)
Total Bilirubin: 0.5 mg/dL (ref 0.3–1.2)
Total Protein: 6 g/dL — ABNORMAL LOW (ref 6.5–8.1)

## 2020-02-15 LAB — URINALYSIS, ROUTINE W REFLEX MICROSCOPIC
Bilirubin Urine: NEGATIVE
Glucose, UA: NEGATIVE mg/dL
Hgb urine dipstick: NEGATIVE
Ketones, ur: 5 mg/dL — AB
Leukocytes,Ua: NEGATIVE
Nitrite: NEGATIVE
Protein, ur: 30 mg/dL — AB
Specific Gravity, Urine: >1.046 — ABNORMAL HIGH (ref 1.005–1.030)
pH: 5 (ref 5.0–8.0)

## 2020-02-15 LAB — I-STAT CHEM 8, ED
BUN: 24 mg/dL — ABNORMAL HIGH (ref 6–20)
Calcium, Ion: 1.03 mmol/L — ABNORMAL LOW (ref 1.15–1.40)
Chloride: 109 mmol/L (ref 98–111)
Creatinine, Ser: 1.3 mg/dL — ABNORMAL HIGH (ref 0.61–1.24)
Glucose, Bld: 110 mg/dL — ABNORMAL HIGH (ref 70–99)
HCT: 37 % — ABNORMAL LOW (ref 39.0–52.0)
Hemoglobin: 12.6 g/dL — ABNORMAL LOW (ref 13.0–17.0)
Potassium: 4.2 mmol/L (ref 3.5–5.1)
Sodium: 145 mmol/L (ref 135–145)
TCO2: 22 mmol/L (ref 22–32)

## 2020-02-15 LAB — SURGICAL PCR SCREEN
MRSA, PCR: NEGATIVE
Staphylococcus aureus: POSITIVE — AB

## 2020-02-15 LAB — RAPID URINE DRUG SCREEN, HOSP PERFORMED
Amphetamines: POSITIVE — AB
Barbiturates: NOT DETECTED
Benzodiazepines: NOT DETECTED
Cocaine: NOT DETECTED
Opiates: NOT DETECTED
Tetrahydrocannabinol: NOT DETECTED

## 2020-02-15 LAB — HIV ANTIBODY (ROUTINE TESTING W REFLEX): HIV Screen 4th Generation wRfx: NONREACTIVE

## 2020-02-15 LAB — SAMPLE TO BLOOD BANK

## 2020-02-15 LAB — PROTIME-INR
INR: 1 (ref 0.8–1.2)
Prothrombin Time: 13.1 seconds (ref 11.4–15.2)

## 2020-02-15 LAB — TRIGLYCERIDES: Triglycerides: 46 mg/dL (ref <150)

## 2020-02-15 LAB — ETHANOL: Alcohol, Ethyl (B): <10 mg/dL (ref <10)

## 2020-02-15 LAB — LACTIC ACID, PLASMA: Lactic Acid, Venous: 2.6 mmol/L (ref 0.5–1.9)

## 2020-02-15 LAB — SARS CORONAVIRUS 2 BY RT PCR (HOSPITAL ORDER, PERFORMED IN ~~LOC~~ HOSPITAL LAB): SARS Coronavirus 2: NEGATIVE

## 2020-02-15 LAB — MRSA PCR SCREENING: MRSA by PCR: NEGATIVE

## 2020-02-15 SURGERY — IRRIGATION AND DEBRIDEMENT EXTREMITY
Anesthesia: General | Laterality: Left

## 2020-02-15 MED ORDER — 0.9 % SODIUM CHLORIDE (POUR BTL) OPTIME
TOPICAL | Status: DC | PRN
  Administered 2020-02-15: 1000 mL

## 2020-02-15 MED ORDER — POLYETHYLENE GLYCOL 3350 17 G PO PACK: 17.0000 g | PACK | Freq: Every day | ORAL | Status: DC

## 2020-02-15 MED ORDER — CHLORHEXIDINE GLUCONATE CLOTH 2 % EX PADS
6.0000 | MEDICATED_PAD | Freq: Every day | CUTANEOUS | Status: DC
  Administered 2020-02-17: 6 via TOPICAL

## 2020-02-15 MED ORDER — ACETAMINOPHEN 325 MG PO TABS: 325.0000 mg | ORAL_TABLET | Freq: Four times a day (QID) | ORAL | Status: DC | PRN

## 2020-02-15 MED ORDER — METHOCARBAMOL 500 MG PO TABS
500.0000 mg | ORAL_TABLET | Freq: Four times a day (QID) | ORAL | Status: DC | PRN
  Administered 2020-02-16: 500 mg via ORAL
  Filled 2020-02-15: qty 1

## 2020-02-15 MED ORDER — ONDANSETRON 4 MG PO TBDP: 4.0000 mg | ORAL_TABLET | Freq: Four times a day (QID) | ORAL | Status: DC | PRN

## 2020-02-15 MED ORDER — KETAMINE HCL 50 MG/5ML IJ SOSY
PREFILLED_SYRINGE | INTRAMUSCULAR | Status: AC
  Administered 2020-02-15: 50 mg
  Filled 2020-02-15: qty 5

## 2020-02-15 MED ORDER — ONDANSETRON HCL 4 MG PO TABS: 4.0000 mg | ORAL_TABLET | Freq: Four times a day (QID) | ORAL | Status: DC | PRN

## 2020-02-15 MED ORDER — FENTANYL CITRATE (PF) 100 MCG/2ML IJ SOLN
INTRAMUSCULAR | Status: AC
  Filled 2020-02-15: qty 2

## 2020-02-15 MED ORDER — POTASSIUM CHLORIDE IN NACL 20-0.9 MEQ/L-% IV SOLN
INTRAVENOUS | Status: DC
  Filled 2020-02-15 (×2): qty 1000

## 2020-02-15 MED ORDER — SODIUM CHLORIDE 0.9 % IV SOLN: INTRAVENOUS | Status: DC

## 2020-02-15 MED ORDER — ONDANSETRON HCL 4 MG/2ML IJ SOLN: 4.0000 mg | Freq: Four times a day (QID) | INTRAMUSCULAR | Status: DC | PRN

## 2020-02-15 MED ORDER — CEFAZOLIN SODIUM-DEXTROSE 1-4 GM/50ML-% IV SOLN
INTRAVENOUS | Status: DC | PRN
Start: 2020-02-15 — End: 2020-02-15
  Administered 2020-02-15: 2 g via INTRAVENOUS

## 2020-02-15 MED ORDER — ORAL CARE MOUTH RINSE
15.0000 mL | OROMUCOSAL | Status: DC
  Administered 2020-02-15 – 2020-02-16 (×14): 15 mL via OROMUCOSAL

## 2020-02-15 MED ORDER — LORAZEPAM 2 MG/ML IJ SOLN: 4.0000 mg | Freq: Once | INTRAMUSCULAR | Status: DC

## 2020-02-15 MED ORDER — CEFAZOLIN SODIUM-DEXTROSE 1-4 GM/50ML-% IV SOLN
1.0000 g | Freq: Four times a day (QID) | INTRAVENOUS | Status: AC
  Administered 2020-02-15 – 2020-02-16 (×3): 1 g via INTRAVENOUS
  Filled 2020-02-15 (×3): qty 50

## 2020-02-15 MED ORDER — MIDAZOLAM HCL 2 MG/2ML IJ SOLN
INTRAMUSCULAR | Status: AC
  Filled 2020-02-15: qty 2

## 2020-02-15 MED ORDER — DOCUSATE SODIUM 50 MG/5ML PO LIQD
100.0000 mg | Freq: Two times a day (BID) | ORAL | Status: DC
  Administered 2020-02-16: 100 mg
  Filled 2020-02-15: qty 10

## 2020-02-15 MED ORDER — METOPROLOL TARTRATE 5 MG/5ML IV SOLN: 5.0000 mg | Freq: Four times a day (QID) | INTRAVENOUS | Status: DC | PRN

## 2020-02-15 MED ORDER — PHENYLEPHRINE HCL (PRESSORS) 10 MG/ML IV SOLN
INTRAVENOUS | Status: DC | PRN
  Administered 2020-02-15: 80 ug via INTRAVENOUS

## 2020-02-15 MED ORDER — PANTOPRAZOLE SODIUM 40 MG IV SOLR
40.0000 mg | Freq: Every day | INTRAVENOUS | Status: DC
  Administered 2020-02-16: 40 mg via INTRAVENOUS
  Filled 2020-02-15 (×2): qty 40

## 2020-02-15 MED ORDER — CEFAZOLIN SODIUM-DEXTROSE 1-4 GM/50ML-% IV SOLN
INTRAVENOUS | Status: AC
  Filled 2020-02-15: qty 100

## 2020-02-15 MED ORDER — LORAZEPAM 2 MG/ML IJ SOLN
INTRAMUSCULAR | Status: AC | PRN
  Administered 2020-02-15: 1 mg via INTRAVENOUS

## 2020-02-15 MED ORDER — BISACODYL 10 MG RE SUPP: 10.0000 mg | Freq: Every day | RECTAL | Status: DC | PRN

## 2020-02-15 MED ORDER — FENTANYL BOLUS VIA INFUSION
50.0000 ug | INTRAVENOUS | Status: DC | PRN
  Filled 2020-02-15: qty 50

## 2020-02-15 MED ORDER — CHLORHEXIDINE GLUCONATE 0.12% ORAL RINSE (MEDLINE KIT)
15.0000 mL | Freq: Two times a day (BID) | OROMUCOSAL | Status: DC
  Administered 2020-02-15 (×2): 15 mL via OROMUCOSAL

## 2020-02-15 MED ORDER — BUPIVACAINE HCL (PF) 0.25 % IJ SOLN
INTRAMUSCULAR | Status: DC | PRN
  Administered 2020-02-15: 10 mL

## 2020-02-15 MED ORDER — HYDROMORPHONE HCL 1 MG/ML IJ SOLN: 0.5000 mg | INTRAMUSCULAR | Status: DC | PRN

## 2020-02-15 MED ORDER — POLYETHYLENE GLYCOL 3350 17 G PO PACK: 17.0000 g | PACK | Freq: Every day | ORAL | Status: DC | PRN

## 2020-02-15 MED ORDER — MAGNESIUM CITRATE PO SOLN: 1.0000 | Freq: Once | ORAL | Status: DC | PRN

## 2020-02-15 MED ORDER — LACTATED RINGERS IV SOLN: INTRAVENOUS | Status: DC | PRN

## 2020-02-15 MED ORDER — IOHEXOL 350 MG/ML SOLN
100.0000 mL | Freq: Once | INTRAVENOUS | Status: AC | PRN
  Administered 2020-02-15: 100 mL via INTRAVENOUS

## 2020-02-15 MED ORDER — ALBUMIN HUMAN 5 % IV SOLN: INTRAVENOUS | Status: DC | PRN

## 2020-02-15 MED ORDER — PANTOPRAZOLE SODIUM 40 MG PO TBEC
40.0000 mg | DELAYED_RELEASE_TABLET | Freq: Every day | ORAL | Status: DC
  Administered 2020-02-17 – 2020-02-18 (×2): 40 mg via ORAL
  Filled 2020-02-15 (×3): qty 1

## 2020-02-15 MED ORDER — MIDAZOLAM HCL 2 MG/2ML IJ SOLN
INTRAMUSCULAR | Status: DC | PRN
Start: 2020-02-15 — End: 2020-02-15
  Administered 2020-02-15 (×2): 2 mg via INTRAVENOUS

## 2020-02-15 MED ORDER — METOCLOPRAMIDE HCL 5 MG/ML IJ SOLN: 5.0000 mg | Freq: Three times a day (TID) | INTRAMUSCULAR | Status: DC | PRN

## 2020-02-15 MED ORDER — FENTANYL 2500MCG IN NS 250ML (10MCG/ML) PREMIX INFUSION
0.0000 ug/h | INTRAVENOUS | Status: DC
  Administered 2020-02-15: 50 ug/h via INTRAVENOUS
  Administered 2020-02-15 – 2020-02-16 (×2): 200 ug/h via INTRAVENOUS
  Filled 2020-02-15 (×3): qty 250

## 2020-02-15 MED ORDER — FENTANYL CITRATE (PF) 100 MCG/2ML IJ SOLN
50.0000 ug | Freq: Once | INTRAMUSCULAR | Status: AC
  Administered 2020-02-15: 50 ug via INTRAVENOUS

## 2020-02-15 MED ORDER — PROPOFOL 1000 MG/100ML IV EMUL
INTRAVENOUS | Status: AC
  Administered 2020-02-15: 20 ug/kg/min
  Filled 2020-02-15: qty 100

## 2020-02-15 MED ORDER — LORAZEPAM 2 MG/ML IJ SOLN
INTRAMUSCULAR | Status: AC
  Filled 2020-02-15: qty 1

## 2020-02-15 MED ORDER — BUPIVACAINE HCL (PF) 0.25 % IJ SOLN
INTRAMUSCULAR | Status: AC
  Filled 2020-02-15: qty 30

## 2020-02-15 MED ORDER — FENTANYL CITRATE (PF) 250 MCG/5ML IJ SOLN
INTRAMUSCULAR | Status: AC
  Filled 2020-02-15: qty 5

## 2020-02-15 MED ORDER — ROCURONIUM BROMIDE 100 MG/10ML IV SOLN
INTRAVENOUS | Status: DC | PRN
Start: 2020-02-15 — End: 2020-02-15
  Administered 2020-02-15: 50 mg via INTRAVENOUS

## 2020-02-15 MED ORDER — METOCLOPRAMIDE HCL 5 MG PO TABS
5.0000 mg | ORAL_TABLET | Freq: Three times a day (TID) | ORAL | Status: DC | PRN
  Filled 2020-02-15: qty 2

## 2020-02-15 MED ORDER — FENTANYL CITRATE (PF) 250 MCG/5ML IJ SOLN
INTRAMUSCULAR | Status: DC | PRN
  Administered 2020-02-15: 150 ug via INTRAVENOUS
  Administered 2020-02-15: 100 ug via INTRAVENOUS

## 2020-02-15 MED ORDER — LORAZEPAM 2 MG/ML IJ SOLN
INTRAMUSCULAR | Status: AC
  Filled 2020-02-15: qty 2

## 2020-02-15 MED ORDER — DOCUSATE SODIUM 50 MG/5ML PO LIQD: 100.0000 mg | Freq: Two times a day (BID) | ORAL | Status: DC

## 2020-02-15 MED ORDER — DOCUSATE SODIUM 100 MG PO CAPS: 100.0000 mg | ORAL_CAPSULE | Freq: Two times a day (BID) | ORAL | Status: DC

## 2020-02-15 MED ORDER — TETANUS-DIPHTH-ACELL PERTUSSIS 5-2.5-18.5 LF-MCG/0.5 IM SUSP
0.5000 mL | Freq: Once | INTRAMUSCULAR | Status: AC
  Administered 2020-02-15: 0.5 mL via INTRAMUSCULAR
  Filled 2020-02-15: qty 0.5

## 2020-02-15 MED ORDER — CEFAZOLIN SODIUM-DEXTROSE 2-4 GM/100ML-% IV SOLN
2.0000 g | Freq: Once | INTRAVENOUS | Status: AC
  Administered 2020-02-15: 2 g via INTRAVENOUS
  Filled 2020-02-15: qty 100

## 2020-02-15 MED ORDER — ROCURONIUM BROMIDE 50 MG/5ML IV SOLN
INTRAVENOUS | Status: AC | PRN
  Administered 2020-02-15: 100 mg via INTRAVENOUS

## 2020-02-15 MED ORDER — OXYCODONE HCL 5 MG PO TABS: 5.0000 mg | ORAL_TABLET | ORAL | Status: DC | PRN

## 2020-02-15 MED ORDER — OXYCODONE HCL 5 MG PO TABS: 10.0000 mg | ORAL_TABLET | ORAL | Status: DC | PRN

## 2020-02-15 MED ORDER — PROPOFOL 1000 MG/100ML IV EMUL
0.0000 ug/kg/min | INTRAVENOUS | Status: DC
  Administered 2020-02-15: 40 ug/kg/min via INTRAVENOUS
  Administered 2020-02-15: 20 ug/kg/min via INTRAVENOUS
  Administered 2020-02-16: 40 ug/kg/min via INTRAVENOUS
  Filled 2020-02-15 (×6): qty 100

## 2020-02-15 MED ORDER — SODIUM CHLORIDE 0.9 % IV SOLN
INTRAVENOUS | Status: AC | PRN
  Administered 2020-02-15: 1000 mL via INTRAVENOUS

## 2020-02-15 MED ORDER — ETOMIDATE 2 MG/ML IV SOLN
INTRAVENOUS | Status: AC | PRN
  Administered 2020-02-15: 20 mg via INTRAVENOUS

## 2020-02-15 MED ORDER — METHOCARBAMOL 1000 MG/10ML IJ SOLN
500.0000 mg | Freq: Four times a day (QID) | INTRAVENOUS | Status: DC | PRN
  Filled 2020-02-15 (×2): qty 5

## 2020-02-15 MED ORDER — FENTANYL CITRATE (PF) 100 MCG/2ML IJ SOLN
100.0000 ug | Freq: Once | INTRAMUSCULAR | Status: AC
  Administered 2020-02-15: 100 ug via INTRAVENOUS

## 2020-02-15 SURGICAL SUPPLY — 42 items
BLADE SURG 21 STRL SS (BLADE) IMPLANT
BNDG COHESIVE 4X5 TAN STRL (GAUZE/BANDAGES/DRESSINGS) ×3 IMPLANT
BNDG COHESIVE 6X5 TAN STRL LF (GAUZE/BANDAGES/DRESSINGS) IMPLANT
BNDG GAUZE ELAST 4 BULKY (GAUZE/BANDAGES/DRESSINGS) ×3 IMPLANT
CORD BIPOLAR FORCEPS 12FT (ELECTRODE) ×3 IMPLANT
COVER SURGICAL LIGHT HANDLE (MISCELLANEOUS) ×6 IMPLANT
COVER WAND RF STERILE (DRAPES) IMPLANT
DRAPE U-SHAPE 47X51 STRL (DRAPES) ×3 IMPLANT
DRSG ADAPTIC 3X8 NADH LF (GAUZE/BANDAGES/DRESSINGS) ×3 IMPLANT
DURAPREP 26ML APPLICATOR (WOUND CARE) ×3 IMPLANT
ELECT REM PT RETURN 9FT ADLT (ELECTROSURGICAL) ×3
ELECTRODE REM PT RTRN 9FT ADLT (ELECTROSURGICAL) ×1 IMPLANT
GAUZE SPONGE 4X4 12PLY STRL (GAUZE/BANDAGES/DRESSINGS) IMPLANT
GLOVE BIOGEL PI IND STRL 9 (GLOVE) ×1 IMPLANT
GLOVE BIOGEL PI INDICATOR 9 (GLOVE) ×2
GLOVE SURG ORTHO 9.0 STRL STRW (GLOVE) ×3 IMPLANT
GOWN STRL REUS W/ TWL XL LVL3 (GOWN DISPOSABLE) ×2 IMPLANT
GOWN STRL REUS W/TWL XL LVL3 (GOWN DISPOSABLE) ×4
HANDPIECE INTERPULSE COAX TIP (DISPOSABLE)
KIT BASIN OR (CUSTOM PROCEDURE TRAY) ×3 IMPLANT
KIT TURNOVER KIT B (KITS) ×3 IMPLANT
MANIFOLD NEPTUNE II (INSTRUMENTS) ×3 IMPLANT
NS IRRIG 1000ML POUR BTL (IV SOLUTION) ×3 IMPLANT
PACK ORTHO EXTREMITY (CUSTOM PROCEDURE TRAY) ×3 IMPLANT
PAD ABD 8X10 STRL (GAUZE/BANDAGES/DRESSINGS) ×3 IMPLANT
PAD ARMBOARD 7.5X6 YLW CONV (MISCELLANEOUS) ×6 IMPLANT
PAD CAST 4YDX4 CTTN HI CHSV (CAST SUPPLIES) ×1 IMPLANT
PADDING CAST COTTON 4X4 STRL (CAST SUPPLIES) ×2
SET HNDPC FAN SPRY TIP SCT (DISPOSABLE) IMPLANT
SPLINT PLASTER CAST XFAST 5X30 (CAST SUPPLIES) ×1 IMPLANT
SPLINT PLASTER XFAST SET 5X30 (CAST SUPPLIES) ×2
STOCKINETTE IMPERVIOUS 9X36 MD (GAUZE/BANDAGES/DRESSINGS) ×3 IMPLANT
SUT ETHILON 2 0 PSLX (SUTURE) IMPLANT
SUT PROLENE 6 0 C 1 30 (SUTURE) ×6 IMPLANT
SUT VIC AB 0 CT1 27 (SUTURE) ×2
SUT VIC AB 0 CT1 27XBRD ANBCTR (SUTURE) ×1 IMPLANT
SWAB COLLECTION DEVICE MRSA (MISCELLANEOUS) IMPLANT
SWAB CULTURE ESWAB REG 1ML (MISCELLANEOUS) IMPLANT
TOWEL GREEN STERILE (TOWEL DISPOSABLE) ×3 IMPLANT
TUBE CONNECTING 12'X1/4 (SUCTIONS) ×1
TUBE CONNECTING 12X1/4 (SUCTIONS) ×2 IMPLANT
YANKAUER SUCT BULB TIP NO VENT (SUCTIONS) ×3 IMPLANT

## 2020-02-15 NOTE — Anesthesia Procedure Notes (Signed)
Date/Time: 02/15/2020 2:20 PM Performed by: Marena Chancy, CRNA Pre-anesthesia Checklist: Patient identified, Emergency Drugs available, Suction available, Patient being monitored and Timeout performed Patient Re-evaluated:Patient Re-evaluated prior to induction Oxygen Delivery Method: Circle system utilized Preoxygenation: Pre-oxygenation with 100% oxygen Induction Type: Inhalational induction with existing ETT
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
## 2020-02-15 NOTE — Anesthesia Preprocedure Evaluation (Addendum)
Anesthesia Evaluation   Patient unresponsive    Reviewed: Allergy & Precautions, NPO status , Patient's Chart, lab work & pertinent test results  History of Anesthesia Complications Negative for: history of anesthetic complications  Airway Mallampati: Intubated       Dental   Pulmonary neg pulmonary ROS,    Pulmonary exam normal        Cardiovascular negative cardio ROS Normal cardiovascular exam     Neuro/Psych negative neurological ROS     GI/Hepatic negative GI ROS, (+)     substance abuse  , Hepatitis -, C  Endo/Other  negative endocrine ROS  Renal/GU negative Renal ROS     Musculoskeletal   Abdominal   Peds  Hematology  (+) anemia ,   Anesthesia Other Findings   Reproductive/Obstetrics                            Anesthesia Physical Anesthesia Plan  ASA: III  Anesthesia Plan: General   Post-op Pain Management:    Induction: Intravenous  PONV Risk Score and Plan:   Airway Management Planned: Oral ETT  Additional Equipment:   Intra-op Plan:   Post-operative Plan: Post-operative intubation/ventilation  Informed Consent: I have reviewed the patients History and Physical, chart, labs and discussed the procedure including the risks, benefits and alternatives for the proposed anesthesia with the patient or authorized representative who has indicated his/her understanding and acceptance.       Plan Discussed with: CRNA, Anesthesiologist and Surgeon  Anesthesia Plan Comments:        Anesthesia Quick Evaluation
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
## 2020-02-15 NOTE — ED Notes (Signed)
Nonviolent restraints applied
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
## 2020-02-15 NOTE — Op Note (Signed)
02/15/2020  3:30 PM  PATIENT:  Blake Green    PRE-OPERATIVE DIAGNOSIS:  LACERATION LEFT ARM  POST-OPERATIVE DIAGNOSIS: Complete laceration of the ulnar nerve with hematoma within the radial nerve and complete disruption of the triceps  PROCEDURE: Irrigation and debridement of the left arm. Repair of the ulnar nerve primarily. Reconstruction of the triceps muscle. Local tissue rearrangement for laceration 20 x 5 cm. Application of medial and lateral splint with patient's elbow extended  SURGEON:  Nadara Mustard, MD  PHYSICIAN ASSISTANT:None ANESTHESIA:   General  PREOPERATIVE INDICATIONS:  Blake Green is a  39 y.o. male with a diagnosis of LACERATION LEFT ARM who failed conservative measures and elected for surgical management.    The risks benefits and alternatives were discussed with the patient preoperatively including but not limited to the risks of infection, bleeding, nerve injury, cardiopulmonary complications, the need for revision surgery, among others, and the patient was willing to proceed.  OPERATIVE IMPLANTS: None  @ENCIMAGES @  OPERATIVE FINDINGS: Complete laceration of the ulnar nerve with hematoma within the radial nerve and complete detachment of the triceps  OPERATIVE PROCEDURE: Patient was brought to the operating room and underwent a general anesthetic patient was brought straight back he was already intubated and on propofol.  After adequate levels anesthesia were obtained patient's left upper extremity was prepped using DuraPrep draped into a sterile field a timeout was called.  Patient underwent initial irrigation and debridement with excision of nonviable muscle fascia and soft tissue.  There were venous bleeding and this was touched with electrocautery.  Patient had bone which was excised from the humerus at the attachment of the triceps.  Patient had a complete laceration of the ulnar nerve at the elbow and hematoma within the radial nerve.  After  irrigation excision of nonviable tissue and hemostasis the elbow was extended and the triceps tendon was repaired and reattached.  With the elbow extended the ulnar nerve was primarily repaired with 6-0 Prolene.  The radial nerve was intact it appeared stretched with hematoma but the nerve was in continuity.  Further subcu closure was performed with 0 Vicryl the skin was closed using 2-0 nylon sterile dressing was applied local tissue rearrangement was used to close the skin wound that was 20 x 5 cm.  After the sterile dressing was applied medial and lateral splints were placed with the elbow at 170 degrees   DISCHARGE PLANNING:  Antibiotic duration: Keep pressure off the nerve repair.  Patient was taken back to trauma ICU intubated  Weightbearing: Not applicable  Pain medication: Continue current protocol  Dressing care/ Wound VAC: No dressing change  Ambulatory devices: Not applicable  Discharge to: Anticipate eventual discharge to home  Follow-up: In the office 1 week post operative.

## 2020-02-15 NOTE — Progress Notes (Signed)
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 Mother states that patient has been taking antibiotics for an infected site on his R foot by his pinky toe where he has presumably been injecting.  Aris Lot, RN

## 2020-02-16 ENCOUNTER — Inpatient Hospital Stay (HOSPITAL_COMMUNITY): Payer: Medicaid Other

## 2020-02-16 MED ORDER — DOCUSATE SODIUM 100 MG PO CAPS
100.0000 mg | ORAL_CAPSULE | Freq: Two times a day (BID) | ORAL | Status: DC
  Administered 2020-02-17: 100 mg via ORAL
  Filled 2020-02-16 (×2): qty 1

## 2020-02-16 MED ORDER — LORAZEPAM 1 MG PO TABS
0.0000 mg | ORAL_TABLET | Freq: Four times a day (QID) | ORAL | Status: DC
  Administered 2020-02-16 – 2020-02-17 (×2): 1 mg via ORAL
  Filled 2020-02-16 (×2): qty 1

## 2020-02-16 MED ORDER — ACETAMINOPHEN 325 MG PO TABS
650.0000 mg | ORAL_TABLET | Freq: Four times a day (QID) | ORAL | Status: DC | PRN
  Administered 2020-02-16: 650 mg via ORAL
  Filled 2020-02-16: qty 2

## 2020-02-16 MED ORDER — ADULT MULTIVITAMIN W/MINERALS CH
1.0000 | ORAL_TABLET | Freq: Every day | ORAL | Status: DC
  Administered 2020-02-16 – 2020-02-18 (×3): 1 via ORAL
  Filled 2020-02-16 (×3): qty 1

## 2020-02-16 MED ORDER — LORAZEPAM 2 MG/ML IJ SOLN
1.0000 mg | INTRAMUSCULAR | Status: DC | PRN
  Administered 2020-02-16: 1 mg via INTRAVENOUS
  Filled 2020-02-16: qty 1

## 2020-02-16 MED ORDER — OXYCODONE HCL 5 MG PO TABS
5.0000 mg | ORAL_TABLET | ORAL | Status: DC | PRN
  Administered 2020-02-16 – 2020-02-17 (×2): 5 mg via ORAL
  Filled 2020-02-16 (×2): qty 1

## 2020-02-16 MED ORDER — THIAMINE HCL 100 MG/ML IJ SOLN: 100.0000 mg | Freq: Every day | INTRAMUSCULAR | Status: DC

## 2020-02-16 MED ORDER — THIAMINE HCL 100 MG PO TABS
100.0000 mg | ORAL_TABLET | Freq: Every day | ORAL | Status: DC
  Administered 2020-02-16 – 2020-02-18 (×3): 100 mg via ORAL
  Filled 2020-02-16 (×3): qty 1

## 2020-02-16 MED ORDER — LORAZEPAM 1 MG PO TABS: 1.0000 mg | ORAL_TABLET | ORAL | Status: DC | PRN

## 2020-02-16 MED ORDER — FOLIC ACID 1 MG PO TABS
1.0000 mg | ORAL_TABLET | Freq: Every day | ORAL | Status: DC
  Administered 2020-02-16 – 2020-02-18 (×3): 1 mg via ORAL
  Filled 2020-02-16 (×3): qty 1

## 2020-02-16 MED ORDER — HYDROMORPHONE HCL 1 MG/ML IJ SOLN: 1.0000 mg | INTRAMUSCULAR | Status: DC | PRN

## 2020-02-16 MED ORDER — LORAZEPAM 1 MG PO TABS: 0.0000 mg | ORAL_TABLET | Freq: Two times a day (BID) | ORAL | Status: DC

## 2020-02-16 MED ORDER — PROPOFOL 1000 MG/100ML IV EMUL
5.0000 ug/kg/min | INTRAVENOUS | Status: DC
  Administered 2020-02-16: 35 ug/kg/min via INTRAVENOUS
  Administered 2020-02-16: 30 ug/kg/min via INTRAVENOUS
  Filled 2020-02-16: qty 100

## 2020-02-16 MED ORDER — BUPRENORPHINE HCL-NALOXONE HCL 8-2 MG SL SUBL
1.0000 | SUBLINGUAL_TABLET | Freq: Two times a day (BID) | SUBLINGUAL | Status: DC
  Administered 2020-02-16 – 2020-02-18 (×4): 1 via SUBLINGUAL
  Filled 2020-02-16 (×4): qty 1

## 2020-02-16 MED ORDER — DEXMEDETOMIDINE HCL IN NACL 400 MCG/100ML IV SOLN
0.4000 ug/kg/h | INTRAVENOUS | Status: DC
  Administered 2020-02-16: 1 ug/kg/h via INTRAVENOUS
  Administered 2020-02-16: 0.4 ug/kg/h via INTRAVENOUS
  Filled 2020-02-16 (×2): qty 100

## 2020-02-16 NOTE — Procedures (Signed)
Extubation Procedure Note  Patient Details:   Name: Blake Green DOB: 28-May-1981 MRN: [id number]   Airway Documentation:    Vent end date: 02/16/20 Vent end time: 1435   Evaluation  O2 sats: stable throughout Complications: No apparent complications Patient did tolerate procedure well. Bilateral Breath Sounds: Clear, Diminished   Yes   Patient extubated per order to 4L [place] with no apparent complications. Positive cuff leak was noted prior to extubation. Patient is alert and is able to speak. Vitals are stable. RT will continue to monitor.   Gracyn Allor Lajuana Ripple 02/16/2020, 2:42 PM

## 2020-02-16 NOTE — Progress Notes (Signed)
Wasted 100 mL of fentanyl in sink w/ Lysle Rubens.

## 2020-02-16 NOTE — Progress Notes (Signed)
5 mg tab oxycodone wasted in sharps w/ Guss Bunde RN

## 2020-02-16 NOTE — Progress Notes (Signed)
Pt yelling at mother in rm and refusing all care.

## 2020-02-16 NOTE — Progress Notes (Addendum)
Pt insists on keeping fiance's engagement ring on finger throughout the night because she cannot stay the night.  Pt and fiance advised against this, and if anything were to happen with the ring, it would not be the responsibility of the hospital.  Ring is silver colored, and has a clear squared stone.
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
## 2020-02-16 NOTE — Progress Notes (Addendum)
Patient ID: Blake Green, male   DOB: 09-08-1981, 39 y.o.   MRN: [id number]  Follow up - Trauma and Critical Care  Patient Details:    Blake Green is an 39 y.o. male.  Lines/tubes : Airway 7.5 mm (Active)  Secured at (cm) 25 cm 02/16/20 0749  Measured From Lips 02/16/20 0749  Secured Location Left 02/16/20 0749  Secured By [employer] 02/16/20 0749  Tube Holder Repositioned Yes 02/16/20 0749  Cuff Pressure (cm H2O) 28 cm H2O 02/16/20 0749  Site Condition Dry 02/16/20 0749     NG/OG Tube Orogastric 16 Fr. Center mouth (Active)  Site Assessment Clean;Dry;Intact 02/16/20 0742  Ongoing Placement Verification No change in cm markings or external length of tube from initial placement;No change in respiratory status;No acute changes, not attributed to clinical condition 02/16/20 0742  Status Clamped 02/16/20 0742     External Urinary Catheter (Active)  Collection Container Standard drainage bag 02/16/20 0800  Site Assessment Clean;Intact 02/16/20 0800  Date Prophylactic Dressing Applied (if applicable) 02/15/20 02/15/20 0300  Intervention Equipment Changed 02/16/20 0800    Microbiology/Sepsis markers: Results for orders placed or performed during the hospital encounter of 02/15/20  SARS Coronavirus 2 by RT PCR (hospital order, performed in [facility] hospital lab) Nasopharyngeal Nasopharyngeal Swab     Status: None   Collection Time: 02/15/20 12:32 AM   Specimen: Nasopharyngeal Swab  Result Value Ref Range Status   SARS Coronavirus 2 NEGATIVE NEGATIVE Final    Comment: (NOTE) SARS-CoV-2 target nucleic acids are NOT DETECTED. The SARS-CoV-2 RNA is generally detectable in upper and lower respiratory specimens during the acute phase of infection. The lowest concentration of SARS-CoV-2 viral copies this assay can detect is 250 copies / mL. A negative result does not preclude SARS-CoV-2 infection and should not be used as the sole basis for treatment or  other patient management decisions.  A negative result may occur with improper specimen collection / handling, submission of specimen other than nasopharyngeal swab, presence of viral mutation(s) within the areas targeted by this assay, and inadequate number of viral copies (<250 copies / mL). A negative result must be combined with clinical observations, patient history, and epidemiological information. Fact Sheet for Patients:   BoilerBrush.com.cy Fact Sheet for Healthcare Providers: https://pope.com/ This test is not yet approved or cleared  by the [place] FDA and has been authorized for detection and/or diagnosis of SARS-CoV-2 by FDA under an Emergency Use Authorization (EUA).  This EUA will remain in effect (meaning this test can be used) for the duration of the COVID-19 declaration under Section 564(b)(1) of the Act, 21 U.S.C. section 360bbb-3(b)(1), unless the authorization is terminated or revoked sooner. Performed at [facility], [address] [place], [place] [address]   MRSA PCR Screening     Status: None   Collection Time: 02/15/20  3:14 AM   Specimen: Nasal Mucosa; Nasopharyngeal  Result Value Ref Range Status   MRSA by PCR NEGATIVE NEGATIVE Final    Comment:        The GeneXpert MRSA Assay (FDA approved for NASAL specimens only), is one component of a comprehensive MRSA colonization surveillance program. It is not intended to diagnose MRSA infection nor to guide or monitor treatment for MRSA infections. Performed at [facility], [address] [place], [place] [address]   Surgical pcr screen     Status: Abnormal   Collection Time: 02/15/20  1:44 PM   Specimen: Nasal Mucosa; Nasal  Swab  Result Value Ref Range Status   MRSA, PCR NEGATIVE NEGATIVE Final   Staphylococcus aureus POSITIVE (A) NEGATIVE Final    Comment: (NOTE) The Xpert SA Assay (FDA approved for NASAL specimens in patients  24 years of age and older), is one component of a comprehensive surveillance program. It is not intended to diagnose infection nor to guide or monitor treatment. Performed at [facility], [address], [place], [place] [address]     Anti-infectives:  Anti-infectives (From admission, onward)   Start     Dose/Rate Route Frequency Ordered Stop   02/15/20 2200  ceFAZolin (ANCEF) IVPB 1 g/50 mL premix     1 g 100 mL/hr over 30 Minutes Intravenous Every 6 hours 02/15/20 2036 02/16/20 1559   02/15/20 0115  ceFAZolin (ANCEF) IVPB 2g/100 mL premix     2 g 200 mL/hr over 30 Minutes Intravenous  Once 02/15/20 0111 02/15/20 1017      Consults:    Chief Complaint/Subjective:    Overnight Issues: Remains intubated, sedated on propofol and fentanyl Complex left arm laceration repaired - damage of ulnar and radial nerves Intermittently very agitated when Propofol weaned  Objective:  Vital signs for last 24 hours: Temp:  [96 F (35.6 C)-98 F (36.7 C)] 97.8 F (36.6 C) (05/31 0800) Pulse Rate:  [76-98] 89 (05/31 0900) Resp:  [14-16] 14 (05/31 0900) BP: (101-135)/(52-92) 131/63 (05/31 0900) SpO2:  [100 %] 100 % (05/31 0900) FiO2 (%):  [40 %] 40 % (05/31 0810)  Hemodynamic parameters for last 24 hours:    Intake/Output from previous day: 05/30 0701 - 05/31 0700 In: 3404.8 [I.V.:2954.8; IV Piggyback:450] Out: 2125 [Urine:2050; Blood:75]  Intake/Output this shift: Total I/O In: 562.4 [I.V.:562.4] Out: -   Vent settings for last 24 hours: Vent Mode: PRVC FiO2 (%):  [40 %] 40 % Set Rate:  [14 bmp] 14 bmp Vt Set:  [600 mL] 600 mL PEEP:  [5 cmH20] 5 cmH20 Pressure Support:  [10 cmH20] 10 cmH20 Plateau Pressure:  [13 cmH20-16 cmH20] 16 cmH20  Physical Exam:  Gen: WDWN - intubated, sedated HEENT: no injuries Resp: CTA B Cardiovascular: RRR Abdomen: Soft, non-tender Ext: LUE in splint Neuro: intubated, sedated  Results for orders placed or performed during the  hospital encounter of 02/15/20 (from the past 24 hour(s))  Urine rapid drug screen (hosp performed)     Status: Abnormal   Collection Time: 02/15/20  1:15 PM  Result Value Ref Range   Opiates NONE DETECTED NONE DETECTED   Cocaine NONE DETECTED NONE DETECTED   Benzodiazepines NONE DETECTED NONE DETECTED   Amphetamines POSITIVE (A) NONE DETECTED   Tetrahydrocannabinol NONE DETECTED NONE DETECTED   Barbiturates NONE DETECTED NONE DETECTED  Surgical pcr screen     Status: Abnormal   Collection Time: 02/15/20  1:44 PM   Specimen: Nasal Mucosa; Nasal Swab  Result Value Ref Range   MRSA, PCR NEGATIVE NEGATIVE   Staphylococcus aureus POSITIVE (A) NEGATIVE  Urinalysis, Routine w reflex microscopic     Status: Abnormal   Collection Time: 02/15/20  1:45 PM  Result Value Ref Range   Color, Urine YELLOW YELLOW   APPearance CLEAR CLEAR   Specific Gravity, Urine >1.046 (H) 1.005 - 1.030   pH 5.0 5.0 - 8.0   Glucose, UA NEGATIVE NEGATIVE mg/dL   Hgb urine dipstick NEGATIVE NEGATIVE   Bilirubin Urine NEGATIVE NEGATIVE   Ketones, ur 5 (A) NEGATIVE mg/dL   Protein, ur 30 (A) NEGATIVE mg/dL  Nitrite NEGATIVE NEGATIVE   Leukocytes,Ua NEGATIVE NEGATIVE   RBC / HPF 0-5 0 - 5 RBC/hpf   WBC, UA 0-5 0 - 5 WBC/hpf   Bacteria, UA RARE (A) NONE SEEN   Squamous Epithelial / LPF 0-5 0 - 5   Mucus PRESENT      Assessment/Plan:  Deep complex laceration through the left upper extremity completely through the triceps muscle with injuries to radial and ulnar nerves No arterial injury Left humerus fracture Drug intoxication - methamphetamine?  Switch Propofol to Precedex Wean vent towards extubation  Additional comments:  I discussed his situation with his mother at the bedside  Critical Care Total Time*: 15 Minutes  Maia Petties 02/16/2020  *Care during the described time interval was provided by me and/or other providers on the critical care team.  I have reviewed this patient's available  data, including medical history, events of note, physical examination and test results as part of my evaluation.

## 2020-02-16 NOTE — Progress Notes (Signed)
PT Cancellation Note  Patient Details Name: Blake Green MRN: [id number] DOB: 1981/08/07   Cancelled Treatment:    Reason Eval/Treat Not Completed: Medical issues which prohibited therapy; patient intubated and combative per RN still on precedex.  Will attempt later if able to extubate today.    [name] 02/16/2020, 12:20 PM  [name], PT Acute Rehabilitation Services [phone] 02/16/2020

## 2020-02-16 NOTE — Progress Notes (Signed)
Patient states he doesn't want vitals taken every hour, removed pulse ox sensor and blood pressure cuff. Will continue to monitor and encourage vitals to be taken.

## 2020-02-16 NOTE — Progress Notes (Signed)
Patient ID: Blake Green, male   DOB: 10-May-1981, 39 y.o.   MRN: [id number] Patient still intubated however more alert this morning.  Patient can wiggle his fingers does not have active extension and does not have intrinsic function at this time.  I discussed with his mother patient had complete transection of the ulnar nerve and had ecchymosis and bruising of the radial nerve.  Discussed that the recovery of the radial nerve may be slow and the ulnar nerve will have prolonged recovery approximately 1 year.  Patient's arm is splinted discussed the importance of keeping his hand elevated level with his heart and from an orthopedic standpoint patient is stable for discharge.

## 2020-02-17 ENCOUNTER — Encounter: Payer: Self-pay | Admitting: *Deleted

## 2020-02-17 LAB — CBC
HCT: 21.3 % — ABNORMAL LOW (ref 39.0–52.0)
Hemoglobin: 6.8 g/dL — CL (ref 13.0–17.0)
MCH: 27.5 pg (ref 26.0–34.0)
MCHC: 31.9 g/dL (ref 30.0–36.0)
MCV: 86.2 fL (ref 80.0–100.0)
Platelets: 156 10*3/uL (ref 150–400)
RBC: 2.47 MIL/uL — ABNORMAL LOW (ref 4.22–5.81)
RDW: 12.8 % (ref 11.5–15.5)
WBC: 6 10*3/uL (ref 4.0–10.5)
nRBC: 0 % (ref 0.0–0.2)

## 2020-02-17 LAB — BASIC METABOLIC PANEL
Anion gap: 9 (ref 5–15)
BUN: 8 mg/dL (ref 6–20)
CO2: 24 mmol/L (ref 22–32)
Calcium: 8 mg/dL — ABNORMAL LOW (ref 8.9–10.3)
Chloride: 107 mmol/L (ref 98–111)
Creatinine, Ser: 0.95 mg/dL (ref 0.61–1.24)
GFR calc Af Amer: >60 mL/min (ref >60)
GFR calc non Af Amer: >60 mL/min (ref >60)
Glucose, Bld: 107 mg/dL — ABNORMAL HIGH (ref 70–99)
Potassium: 3.7 mmol/L (ref 3.5–5.1)
Sodium: 140 mmol/L (ref 135–145)

## 2020-02-17 LAB — PREPARE RBC (CROSSMATCH)

## 2020-02-17 LAB — ABO/RH: ABO/RH(D): O NEG

## 2020-02-17 MED ORDER — HYDROMORPHONE HCL 1 MG/ML IJ SOLN: 0.5000 mg | INTRAMUSCULAR | Status: DC | PRN

## 2020-02-17 MED ORDER — MUPIROCIN 2 % EX OINT
1.0000 "application " | TOPICAL_OINTMENT | Freq: Two times a day (BID) | CUTANEOUS | Status: DC
  Administered 2020-02-17: 1 via NASAL
  Filled 2020-02-17: qty 22

## 2020-02-17 MED ORDER — PHENOL 1.4 % MT LIQD
1.0000 | OROMUCOSAL | Status: DC | PRN
  Filled 2020-02-17: qty 177

## 2020-02-17 MED ORDER — ACETAMINOPHEN 500 MG PO TABS
1000.0000 mg | ORAL_TABLET | Freq: Four times a day (QID) | ORAL | Status: DC
  Administered 2020-02-17 – 2020-02-18 (×4): 1000 mg via ORAL
  Filled 2020-02-17 (×4): qty 2

## 2020-02-17 MED ORDER — SODIUM CHLORIDE 0.9% IV SOLUTION: Freq: Once | INTRAVENOUS | Status: AC

## 2020-02-17 MED ORDER — METHOCARBAMOL 500 MG PO TABS
500.0000 mg | ORAL_TABLET | Freq: Four times a day (QID) | ORAL | Status: DC | PRN
  Administered 2020-02-17: 500 mg via ORAL
  Filled 2020-02-17: qty 1

## 2020-02-17 MED ORDER — CHLORHEXIDINE GLUCONATE CLOTH 2 % EX PADS: 6.0000 | MEDICATED_PAD | Freq: Every day | CUTANEOUS | Status: DC
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
## 2020-02-17 NOTE — Progress Notes (Signed)
Pt arrived to floor. Pt refused vital signs upon arrival but agreeable for assessment and to take medications. Pt stating "I just want to try to get some sleep and rest".  Charge nurse updated and will continue plan of care.

## 2020-02-17 NOTE — TOC CAGE-AID Note (Addendum)
Transition of Care [facility]) - CAGE-AID Screening   Patient Details  Name: Blake Green MRN: [id number] Date of Birth: 1981-08-04  [facility] CM/SW Contact:    Jimmy Picket, LCSWA Phone Number: 02/17/2020, 10:55 AM   Clinical Narrative:  Pt stated that he does not drink alcohol. 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 Pt stated he recently started getting suboxone from a private doctor in [place]. Pt stated he is using less meth/amohetamines as before. Pt denied resources and stated hes already in treatment.  CAGE-AID Screening:    Have You Ever Felt You Ought to Cut Down on Your Drinking or Drug Use?: Yes Have People Annoyed You By Critizing Your Drinking Or Drug Use?: Yes Have You Felt Bad Or Guilty About Your Drinking Or Drug Use?: No Have You Ever Had a Drink or Used Drugs First Thing In The Morning to STeady Your Nerves or to Get Rid of a Hangover?: No CAGE-AID Score: 2  Substance Abuse Education Offered: Yes  Substance abuse interventions: Patient Counseling  Jimmy Picket, Theresia Majors, [place] Clinical Social Worker [phone]
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
## 2020-02-17 NOTE — Progress Notes (Signed)
This RN went into patient's room to perform all initial shift assessment. Patient stated he wants a different nurse tonight, would not give a reason why, and stated for me not to touch him. This AM after shift on 02/16/2020, patient stated how good of a job I did and he was grateful to me.  Patient refused to allow vitals to be taken.  Unable to complete assessment. Charge nurse made aware. Unable to change assignments at this time. Will continue to monitor.
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
## 2020-02-18 LAB — TYPE AND SCREEN
ABO/RH(D): O NEG
Antibody Screen: NEGATIVE
Unit division: 0

## 2020-02-18 LAB — CBC
HCT: 23.5 % — ABNORMAL LOW (ref 39.0–52.0)
Hemoglobin: 7.5 g/dL — ABNORMAL LOW (ref 13.0–17.0)
MCH: 27.5 pg (ref 26.0–34.0)
MCHC: 31.9 g/dL (ref 30.0–36.0)
MCV: 86.1 fL (ref 80.0–100.0)
Platelets: 168 10*3/uL (ref 150–400)
RBC: 2.73 MIL/uL — ABNORMAL LOW (ref 4.22–5.81)
RDW: 13.5 % (ref 11.5–15.5)
WBC: 5.7 10*3/uL (ref 4.0–10.5)
nRBC: 0 % (ref 0.0–0.2)

## 2020-02-18 LAB — BASIC METABOLIC PANEL
Anion gap: 6 (ref 5–15)
BUN: 6 mg/dL (ref 6–20)
CO2: 24 mmol/L (ref 22–32)
Calcium: 8 mg/dL — ABNORMAL LOW (ref 8.9–10.3)
Chloride: 109 mmol/L (ref 98–111)
Creatinine, Ser: 0.76 mg/dL (ref 0.61–1.24)
GFR calc Af Amer: >60 mL/min (ref >60)
GFR calc non Af Amer: >60 mL/min (ref >60)
Glucose, Bld: 137 mg/dL — ABNORMAL HIGH (ref 70–99)
Potassium: 3.8 mmol/L (ref 3.5–5.1)
Sodium: 139 mmol/L (ref 135–145)

## 2020-02-18 LAB — BPAM RBC
Blood Product Expiration Date: 202106112359
ISSUE DATE / TIME: 202106011114
Unit Type and Rh: 9500

## 2020-02-18 MED ORDER — ACETAMINOPHEN 500 MG PO TABS: 1000.0000 mg | ORAL_TABLET | Freq: Four times a day (QID) | ORAL | 0 refills | Status: AC | PRN

## 2020-02-18 MED ORDER — METHOCARBAMOL 500 MG PO TABS: 500.0000 mg | ORAL_TABLET | Freq: Four times a day (QID) | ORAL | 0 refills | Status: AC | PRN

## 2020-02-18 MED ORDER — CENTRUM PO CHEW: 1.0000 | CHEWABLE_TABLET | Freq: Every day | ORAL | Status: AC

## 2020-02-18 MED ORDER — POLYETHYLENE GLYCOL 3350 17 G PO PACK: 17.0000 g | PACK | Freq: Every day | ORAL | 0 refills | Status: AC | PRN
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
## 2020-02-18 NOTE — Progress Notes (Signed)
Patient ID: Blake Green, male   DOB: 1981/08/17, 39 y.o.   MRN: [id number] Patient is alert and oriented this morning status post ulnar nerve reconstruction and reattachment of the triceps muscle.  Patient had bruising of the radial nerve.  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  Anticipate discharge to home today I will follow-up in the office in 1 week patient was given instructions to keep the hand elevated above his heart and work on wiggling the fingers to decrease the swelling in the hand.

## 2020-02-18 NOTE — Discharge Instructions (Signed)
Do not submerge or get your arm wet.  Keep dry  do not lift anything with your left arm Take an iron supplement to help get your blood levels back up

## 2020-02-18 NOTE — Progress Notes (Signed)
Discharge instructions addressed; pt in stable condition; Pt.'s mom in room & will be pt.'s ride home.

## 2020-02-18 NOTE — Care Management Important Message (Signed)
Important Message  Patient Details  Name: Blake Green MRN: [id number] Date of Birth: 07/30/1981   Medicare Important Message Given:  Yes  Patient left prior to IM delivery.  IM mailed to patient address.    Takeesha Isley 02/18/2020, 3:12 PM

## 2020-02-20 ENCOUNTER — Encounter (HOSPITAL_BASED_OUTPATIENT_CLINIC_OR_DEPARTMENT_OTHER): Payer: Self-pay | Admitting: *Deleted

## 2020-02-25 ENCOUNTER — Ambulatory Visit (INDEPENDENT_AMBULATORY_CARE_PROVIDER_SITE_OTHER): Payer: Self-pay | Admitting: Physician Assistant

## 2020-02-25 ENCOUNTER — Encounter: Payer: Self-pay | Admitting: Physician Assistant

## 2020-02-25 VITALS — Ht 74.0 in | Wt 200.0 lb

## 2020-02-25 DIAGNOSIS — S41112A Laceration without foreign body of left upper arm, initial encounter: Secondary | ICD-10-CM

## 2020-02-25 MED ORDER — OXYCODONE HCL 5 MG PO TABS: 5.0000 mg | ORAL_TABLET | ORAL | 0 refills | Status: AC | PRN
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
## 2020-03-03 ENCOUNTER — Ambulatory Visit (INDEPENDENT_AMBULATORY_CARE_PROVIDER_SITE_OTHER): Payer: Medicaid Other

## 2020-03-03 ENCOUNTER — Ambulatory Visit (INDEPENDENT_AMBULATORY_CARE_PROVIDER_SITE_OTHER): Payer: Medicaid Other | Admitting: Physician Assistant

## 2020-03-03 ENCOUNTER — Encounter: Payer: Self-pay | Admitting: Physician Assistant

## 2020-03-03 VITALS — Ht 74.0 in | Wt 200.0 lb

## 2020-03-03 DIAGNOSIS — S41112A Laceration without foreign body of left upper arm, initial encounter: Secondary | ICD-10-CM

## 2020-03-03 MED ORDER — MELOXICAM 7.5 MG PO TABS: 7.5000 mg | ORAL_TABLET | Freq: Every day | ORAL | 0 refills | Status: AC
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
## 2020-03-17 ENCOUNTER — Encounter: Payer: Self-pay | Admitting: Physician Assistant

## 2020-03-17 ENCOUNTER — Other Ambulatory Visit: Payer: Self-pay

## 2020-03-17 ENCOUNTER — Ambulatory Visit (INDEPENDENT_AMBULATORY_CARE_PROVIDER_SITE_OTHER): Payer: Medicaid Other | Admitting: Physician Assistant

## 2020-03-17 DIAGNOSIS — S41112A Laceration without foreign body of left upper arm, initial encounter: Secondary | ICD-10-CM
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
## 2021-09-18 DEATH — deceased
